# Patient Record
Sex: Male | Born: 1980 | Race: White | Hispanic: No | Marital: Single | Smoking: Current every day smoker
Health system: Southern US, Community
[De-identification: ages and names within clinical notes are randomized; demographics above are authoritative.]

## PROBLEM LIST (undated history)

## (undated) DIAGNOSIS — M25569 Pain in unspecified knee: Secondary | ICD-10-CM

## (undated) DIAGNOSIS — M549 Dorsalgia, unspecified: Secondary | ICD-10-CM

## (undated) HISTORY — PX: KNEE SURGERY: SHX244

---
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
## 2003-04-20 ENCOUNTER — Emergency Department (HOSPITAL_COMMUNITY): Admission: EM | Admit: 2003-04-20 | Discharge: 2003-04-20 | Payer: Self-pay | Admitting: Emergency Medicine

## 2003-07-30 ENCOUNTER — Emergency Department (HOSPITAL_COMMUNITY): Admission: EM | Admit: 2003-07-30 | Discharge: 2003-07-30 | Payer: Self-pay | Admitting: Emergency Medicine

## 2005-01-29 ENCOUNTER — Emergency Department (HOSPITAL_COMMUNITY): Admission: EM | Admit: 2005-01-29 | Discharge: 2005-01-29 | Payer: Self-pay | Admitting: Emergency Medicine

## 2007-01-07 ENCOUNTER — Emergency Department (HOSPITAL_COMMUNITY): Admission: EM | Admit: 2007-01-07 | Discharge: 2007-01-08 | Payer: Self-pay | Admitting: Emergency Medicine

## 2007-05-15 ENCOUNTER — Emergency Department (HOSPITAL_COMMUNITY): Admission: EM | Admit: 2007-05-15 | Discharge: 2007-05-15 | Payer: Self-pay | Admitting: Emergency Medicine

## 2007-05-30 ENCOUNTER — Emergency Department (HOSPITAL_COMMUNITY): Admission: EM | Admit: 2007-05-30 | Discharge: 2007-05-30 | Payer: Self-pay | Admitting: Emergency Medicine

## 2008-02-20 ENCOUNTER — Emergency Department (HOSPITAL_COMMUNITY): Admission: EM | Admit: 2008-02-20 | Discharge: 2008-02-20 | Payer: Self-pay | Admitting: Emergency Medicine

## 2008-11-19 ENCOUNTER — Emergency Department (HOSPITAL_COMMUNITY): Admission: EM | Admit: 2008-11-19 | Discharge: 2008-11-19 | Payer: Self-pay | Admitting: Emergency Medicine

## 2009-12-22 ENCOUNTER — Emergency Department (HOSPITAL_COMMUNITY)
Admission: EM | Admit: 2009-12-22 | Discharge: 2009-12-22 | Payer: Self-pay | Source: Home / Self Care | Admitting: Emergency Medicine

## 2010-02-18 ENCOUNTER — Emergency Department (HOSPITAL_COMMUNITY)
Admission: EM | Admit: 2010-02-18 | Discharge: 2010-02-19 | Payer: Self-pay | Source: Home / Self Care | Admitting: Emergency Medicine

## 2010-04-03 LAB — URINE MICROSCOPIC-ADD ON

## 2010-04-03 LAB — URINALYSIS, ROUTINE W REFLEX MICROSCOPIC
Glucose, UA: NEGATIVE mg/dL
Nitrite: NEGATIVE
Specific Gravity, Urine: 1.007 (ref 1.005–1.030)

## 2010-04-03 LAB — GC/CHLAMYDIA PROBE AMP, GENITAL: GC Probe Amp, Genital: POSITIVE — AB

## 2010-04-16 ENCOUNTER — Emergency Department (HOSPITAL_COMMUNITY)
Admission: EM | Admit: 2010-04-16 | Discharge: 2010-04-17 | Disposition: A | Discharge status: Home / Self Care | Payer: Self-pay | Attending: Emergency Medicine | Admitting: Emergency Medicine

## 2010-04-16 ENCOUNTER — Emergency Department (HOSPITAL_COMMUNITY): Payer: Self-pay

## 2010-04-16 DIAGNOSIS — M25569 Pain in unspecified knee: Secondary | ICD-10-CM | POA: Insufficient documentation

## 2010-05-07 LAB — RAPID URINE DRUG SCREEN, HOSP PERFORMED
Amphetamines: NOT DETECTED
Cocaine: NOT DETECTED
Opiates: POSITIVE — AB

## 2012-06-15 ENCOUNTER — Emergency Department (HOSPITAL_COMMUNITY): Payer: Self-pay

## 2012-06-15 ENCOUNTER — Encounter (HOSPITAL_COMMUNITY): Payer: Self-pay | Admitting: Adult Health

## 2012-06-15 ENCOUNTER — Emergency Department (HOSPITAL_COMMUNITY)
Admission: EM | Admit: 2012-06-15 | Discharge: 2012-06-16 | Disposition: A | Discharge status: Home / Self Care | Payer: Self-pay | Attending: Emergency Medicine | Admitting: Emergency Medicine

## 2012-06-15 DIAGNOSIS — S83104A Unspecified dislocation of right knee, initial encounter: Secondary | ICD-10-CM

## 2012-06-15 DIAGNOSIS — Y9389 Activity, other specified: Secondary | ICD-10-CM | POA: Insufficient documentation

## 2012-06-15 DIAGNOSIS — S83106A Unspecified dislocation of unspecified knee, initial encounter: Secondary | ICD-10-CM | POA: Insufficient documentation

## 2012-06-15 DIAGNOSIS — F17200 Nicotine dependence, unspecified, uncomplicated: Secondary | ICD-10-CM | POA: Insufficient documentation

## 2012-06-15 DIAGNOSIS — Y929 Unspecified place or not applicable: Secondary | ICD-10-CM | POA: Insufficient documentation

## 2012-06-15 DIAGNOSIS — X500XXA Overexertion from strenuous movement or load, initial encounter: Secondary | ICD-10-CM | POA: Insufficient documentation

## 2012-06-15 NOTE — ED Notes (Signed)
Resents with knee pain that began 2 hours ago, he states he fell on his right knee and it began to swell states he heard a pop. Knee is red, no swelling noted.

## 2012-06-15 NOTE — ED Notes (Signed)
The pt is c/o pain in his rt knee he is crying.  He lifted his son earlier tonight one hour ago and since then he has had rt knee pain.  Red but no swelling

## 2012-06-16 MED ORDER — OXYCODONE-ACETAMINOPHEN 5-325 MG PO TABS: 1.0000 | ORAL_TABLET | ORAL | Status: DC | PRN

## 2012-06-16 MED ORDER — OXYCODONE-ACETAMINOPHEN 5-325 MG PO TABS
2.0000 | ORAL_TABLET | Freq: Once | ORAL | Status: AC
  Administered 2012-06-16: 2 via ORAL
  Filled 2012-06-16: qty 2

## 2012-06-16 NOTE — ED Provider Notes (Signed)
History     CSN: [id number]  Arrival date & time 06/15/12  2251   First MD Initiated Contact with Patient 06/15/12 2331      Chief Complaint  Patient presents with  . Knee Pain    (Consider location/radiation/quality/duration/timing/severity/associated sxs/prior treatment) HPI Comments: Patient states he planted his R foot than twisted felt/heard a POP in R knee now painful with movement ambulation.  NO Meds PTA   Patient is a 32 y.o. male presenting with knee pain. The history is provided by the patient.  Knee Pain Location:  Knee Time since incident:  3 hours Injury: no   Pain details:    Quality:  Throbbing   Radiates to:  Does not radiate   Severity:  Severe   Duration:  3 hours   Timing:  Constant Chronicity:  New Dislocation: no   Prior injury to area:  No Worsened by:  Extension and flexion Ineffective treatments:  None tried Associated symptoms: no fever     History reviewed. No pertinent past medical history.  History reviewed. No pertinent past surgical history.  History reviewed. No pertinent family history.  History  Substance Use Topics  . Smoking status: Current Every Day Smoker    Types: Cigarettes  . Smokeless tobacco: Not on file  . Alcohol Use: No      Review of Systems  Constitutional: Negative for fever and chills.  Musculoskeletal: Positive for arthralgias. Negative for joint swelling.  Skin: Negative for wound.  All other systems reviewed and are negative.    Allergies  Review of patient's allergies indicates no known allergies.  Home Medications   Current Outpatient Rx  Name  Route  Sig  Dispense  Refill  . oxyCODONE-acetaminophen (PERCOCET/ROXICET) 5-325 MG per tablet   Oral   Take 1 tablet by mouth every 4 (four) hours as needed for pain.   20 tablet   0     BP 116/91  Pulse 75  Temp(Src) 98.4 F (36.9 C) (Oral)  Resp 18  SpO2 98%  Physical Exam  Nursing note and vitals reviewed. Constitutional: He is  oriented to person, place, and time. He appears well-developed and well-nourished.  HENT:  Head: Normocephalic.  Eyes: Pupils are equal, round, and reactive to light.  Neck: Normal range of motion.  Cardiovascular: Normal rate.   Musculoskeletal: He exhibits tenderness. He exhibits no edema.       Right knee: He exhibits decreased range of motion. He exhibits no swelling, no effusion, no ecchymosis, no deformity, no laceration, no erythema, normal alignment, no LCL laxity and no MCL laxity. Tenderness found. Lateral joint line and LCL tenderness noted. No medial joint line tenderness noted.  Neurological: He is alert and oriented to person, place, and time.    ED Course  Procedures (including critical care time)  Labs Reviewed - No data to display Dg Knee Complete 4 Views Right  06/16/2012   *RADIOLOGY REPORT*  Clinical Data: Lateral and posterior right knee pain; heard pop at the right knee.  RIGHT KNEE - COMPLETE 4+ VIEW  Comparison: None.  Findings: There is no evidence of fracture or dislocation.  The joint spaces are preserved.  No significant degenerative change is seen; the patellofemoral joint is grossly unremarkable in appearance.  No significant joint effusion is seen.  The visualized soft tissues are normal in appearance.  IMPRESSION: No evidence of fracture or dislocation.   Original Report Authenticated By: Tonia Ghent, M.D.     1. Knee dislocation, right, initial  encounter       MDM   Xray reviewed no fx, dislocation        Arman Filter, NP 06/16/12 0045

## 2012-06-16 NOTE — ED Notes (Signed)
Pain med given ortho tech called for knee immobilizer and crutches

## 2012-06-16 NOTE — ED Notes (Signed)
The pt has stopped crying now.  Sitting in a chair.  Waiting for orders

## 2012-06-16 NOTE — ED Provider Notes (Signed)
Medical screening examination/treatment/procedure(s) were performed by non-physician practitioner and as supervising physician I was immediately available for consultation/collaboration. Devoria Albe, MD, FACEP    Ward Givens, MD 06/16/12 [phone]

## 2013-07-25 ENCOUNTER — Emergency Department (HOSPITAL_COMMUNITY): Payer: Self-pay

## 2013-07-25 ENCOUNTER — Encounter (HOSPITAL_COMMUNITY): Payer: Self-pay | Admitting: Emergency Medicine

## 2013-07-25 ENCOUNTER — Emergency Department (HOSPITAL_COMMUNITY)
Admission: EM | Admit: 2013-07-25 | Discharge: 2013-07-25 | Disposition: A | Discharge status: Home / Self Care | Payer: Self-pay | Attending: Emergency Medicine | Admitting: Emergency Medicine

## 2013-07-25 DIAGNOSIS — F1910 Other psychoactive substance abuse, uncomplicated: Secondary | ICD-10-CM

## 2013-07-25 DIAGNOSIS — G929 Unspecified toxic encephalopathy: Secondary | ICD-10-CM | POA: Insufficient documentation

## 2013-07-25 DIAGNOSIS — R4182 Altered mental status, unspecified: Secondary | ICD-10-CM | POA: Insufficient documentation

## 2013-07-25 DIAGNOSIS — Y92410 Unspecified street and highway as the place of occurrence of the external cause: Secondary | ICD-10-CM | POA: Insufficient documentation

## 2013-07-25 DIAGNOSIS — S20219A Contusion of unspecified front wall of thorax, initial encounter: Secondary | ICD-10-CM | POA: Insufficient documentation

## 2013-07-25 DIAGNOSIS — F17200 Nicotine dependence, unspecified, uncomplicated: Secondary | ICD-10-CM | POA: Insufficient documentation

## 2013-07-25 DIAGNOSIS — Y9389 Activity, other specified: Secondary | ICD-10-CM | POA: Insufficient documentation

## 2013-07-25 DIAGNOSIS — G92 Toxic encephalopathy: Secondary | ICD-10-CM

## 2013-07-25 DIAGNOSIS — T148XXA Other injury of unspecified body region, initial encounter: Secondary | ICD-10-CM

## 2013-07-25 DIAGNOSIS — F1110 Opioid abuse, uncomplicated: Secondary | ICD-10-CM | POA: Insufficient documentation

## 2013-07-25 LAB — CBC WITH DIFFERENTIAL/PLATELET
BASOS PCT: 0 % (ref 0–1)
Basophils Absolute: 0 10*3/uL (ref 0.0–0.1)
EOS ABS: 0.1 10*3/uL (ref 0.0–0.7)
EOS PCT: 2 % (ref 0–5)
HEMATOCRIT: 39.8 % (ref 39.0–52.0)
HEMOGLOBIN: 13 g/dL (ref 13.0–17.0)
LYMPHS ABS: 1.3 10*3/uL (ref 0.7–4.0)
Lymphocytes Relative: 18 % (ref 12–46)
MCH: 29.7 pg (ref 26.0–34.0)
MCHC: 32.7 g/dL (ref 30.0–36.0)
MCV: 91.1 fL (ref 78.0–100.0)
MONO ABS: 0.7 10*3/uL (ref 0.1–1.0)
MONOS PCT: 9 % (ref 3–12)
Neutro Abs: 5.2 10*3/uL (ref 1.7–7.7)
Neutrophils Relative %: 71 % (ref 43–77)
Platelets: 153 10*3/uL (ref 150–400)
RBC: 4.37 MIL/uL (ref 4.22–5.81)
RDW: 14.4 % (ref 11.5–15.5)
WBC: 7.3 10*3/uL (ref 4.0–10.5)

## 2013-07-25 LAB — URINALYSIS, ROUTINE W REFLEX MICROSCOPIC
BILIRUBIN URINE: NEGATIVE
GLUCOSE, UA: NEGATIVE mg/dL
Hgb urine dipstick: NEGATIVE
Ketones, ur: NEGATIVE mg/dL
Leukocytes, UA: NEGATIVE
NITRITE: NEGATIVE
Protein, ur: NEGATIVE mg/dL
SPECIFIC GRAVITY, URINE: 1.037 — AB (ref 1.005–1.030)
Urobilinogen, UA: 1 mg/dL (ref 0.0–1.0)
pH: 6 (ref 5.0–8.0)

## 2013-07-25 LAB — BASIC METABOLIC PANEL
Anion gap: 13 (ref 5–15)
BUN: 11 mg/dL (ref 6–23)
CO2: 26 mEq/L (ref 19–32)
CREATININE: 0.91 mg/dL (ref 0.50–1.35)
Calcium: 8.9 mg/dL (ref 8.4–10.5)
Chloride: 100 mEq/L (ref 96–112)
GLUCOSE: 102 mg/dL — AB (ref 70–99)
Potassium: 3.5 mEq/L — ABNORMAL LOW (ref 3.7–5.3)
Sodium: 139 mEq/L (ref 137–147)

## 2013-07-25 LAB — CBG MONITORING, ED: Glucose-Capillary: 73 mg/dL (ref 70–99)

## 2013-07-25 LAB — ETHANOL: Alcohol, Ethyl (B): <11 mg/dL (ref 0–11)

## 2013-07-25 LAB — ACETAMINOPHEN LEVEL: Acetaminophen (Tylenol), Serum: <15 ug/mL (ref 10–30)

## 2013-07-25 MED ORDER — SODIUM CHLORIDE 0.9 % IV BOLUS (SEPSIS)
1000.0000 mL | Freq: Once | INTRAVENOUS | Status: AC
  Administered 2013-07-25: 1000 mL via INTRAVENOUS

## 2013-07-25 MED ORDER — IOHEXOL 300 MG/ML  SOLN
80.0000 mL | Freq: Once | INTRAMUSCULAR | Status: AC | PRN
  Administered 2013-07-25: 80 mL via INTRAVENOUS
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
## 2013-07-25 NOTE — ED Notes (Signed)
Pt discharged to home with family. NAD.  
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
## 2013-07-25 NOTE — Discharge Instructions (Signed)
If you were given medicines take as directed.  If you are on coumadin or contraceptives realize their levels and effectiveness is altered by many different medicines.  If you have any reaction (rash, tongues swelling, other) to the medicines stop taking and see a physician.   Please follow up as directed and return to the ER or see a physician for new or worsening symptoms.  Thank you. Filed Vitals:   07/25/13 0955 07/25/13 1015 07/25/13 1030 07/25/13 1045  BP: 104/61 108/62 115/70 108/66  Pulse:  57 63 56  Temp: 97.6 F (36.4 C)     TempSrc: Oral     Resp: 16 14 13 16   Height:      Weight:      SpO2: 97% 97% 95% 98%

## 2013-07-26 ENCOUNTER — Encounter (HOSPITAL_COMMUNITY): Payer: Self-pay | Admitting: Adult Health

## 2013-09-16 ENCOUNTER — Encounter (HOSPITAL_COMMUNITY): Payer: Self-pay | Admitting: Emergency Medicine

## 2013-09-16 ENCOUNTER — Emergency Department (HOSPITAL_COMMUNITY)
Admission: EM | Admit: 2013-09-16 | Discharge: 2013-09-16 | Disposition: A | Discharge status: Home / Self Care | Payer: Self-pay | Attending: Emergency Medicine | Admitting: Emergency Medicine

## 2013-09-16 DIAGNOSIS — S61409A Unspecified open wound of unspecified hand, initial encounter: Secondary | ICD-10-CM | POA: Insufficient documentation

## 2013-09-16 DIAGNOSIS — W268XXA Contact with other sharp object(s), not elsewhere classified, initial encounter: Secondary | ICD-10-CM | POA: Insufficient documentation

## 2013-09-16 DIAGNOSIS — F17200 Nicotine dependence, unspecified, uncomplicated: Secondary | ICD-10-CM | POA: Insufficient documentation

## 2013-09-16 DIAGNOSIS — Y929 Unspecified place or not applicable: Secondary | ICD-10-CM | POA: Insufficient documentation

## 2013-09-16 DIAGNOSIS — IMO0002 Reserved for concepts with insufficient information to code with codable children: Secondary | ICD-10-CM

## 2013-09-16 DIAGNOSIS — Y9389 Activity, other specified: Secondary | ICD-10-CM | POA: Insufficient documentation

## 2013-09-16 MED ORDER — CEPHALEXIN 500 MG PO CAPS: 500.0000 mg | ORAL_CAPSULE | Freq: Three times a day (TID) | ORAL | Status: DC

## 2013-09-16 MED ORDER — HYDROCODONE-ACETAMINOPHEN 5-325 MG PO TABS: ORAL_TABLET | ORAL | Status: DC

## 2013-09-16 NOTE — Discharge Instructions (Signed)

## 2013-09-16 NOTE — ED Notes (Signed)
Pt arrives for eval of laceration to right hand from a piece of glass yesterday. Bleeding controlled at this time, pt states he thought it would heal on its on but started bleeding again today. nad noted. Pulses present and strong to right hand. Axox4

## 2013-09-16 NOTE — ED Provider Notes (Signed)
  Chief Complaint   Chief Complaint  Patient presents with  . Extremity Laceration    History of Present Illness   Billy Miles is a 33 year old male who cut his right hand on glass yesterday around 2 PM. He has a laceration on the thenar eminence. He's had trouble controlling the bleeding. He has a full range of motion of all digits. He denies any numbness or tingling. He is up-to-date on tetanus immunization.  Review of Systems   Other than as noted above, the patient denies any of the following symptoms: Musculoskeletal:  No joint pain or decreased range of motion. Neuro:  No numbness, tingling, or weakness.  PMFSH   Past medical history, family history, social history, meds, and allergies were reviewed.   Physical Examination     Vital signs:  BP 122/91  Pulse 84  Temp(Src) 98.1 F (36.7 C) (Oral)  Resp 16  Ht  (1.626 m)  Wt 170 lb (77.111 kg)  BMI 29.17 kg/m2  SpO2 97% Ext:  There is a 1.5 cm laceration on the thenar eminence. There is no foreign body or contamination. He has a full range of motion of all digits, full function of flexor tendons, and normal sensation.  All other joints had a full ROM without pain.  Pulses were full.  Good capillary refill in all digits.  No edema. Neurological:  Alert and oriented.  No muscle weakness.  Sensation was intact to light touch.   Procedure Note:  Verbal informed consent was obtained.  The patient was informed of the risks and benefits of the procedure and understands and accepts.  A time out was called and the identity of the patient and correct procedure were confirmed.   The laceration area described above was prepped with Betadine and saline  and anesthetized with 5 mL of 2% Xylocaine without epinephrine.  The wound was then closed as follows:  Wound edges were approximated with 4 5-0 nylon sutures.  There were no immediate complications, and the patient tolerated the procedure well. The laceration was then cleansed,  Bacitracin ointment was applied and a clean, dry pressure dressing was put on.    Assessment   The encounter diagnosis was Laceration.  Plan   1.  Meds:  The following meds were prescribed:   Discharge Medication List as of 09/16/2013  4:37 PM    START taking these medications   Details  cephALEXin (KEFLEX) 500 MG capsule Take 1 capsule (500 mg total) by mouth 3 (three) times daily., Starting 09/16/2013, Until Discontinued, Print    HYDROcodone-acetaminophen (NORCO/VICODIN) 5-325 MG per tablet 1 to 2 tabs every 4 to 6 hours as needed for pain., Print        2.  Patient Education/Counseling:  The patient was given appropriate handouts, self care instructions, and instructed in symptomatic relief. Instructions were given for wound care.    3.  Follow up:  The patient was told to follow up immediately if there is any sign of infection.The patient will return in 14 days for suture removal.      Reuben Likes, MD 09/16/13 2024

## 2014-01-21 ENCOUNTER — Encounter (HOSPITAL_COMMUNITY): Payer: Self-pay | Admitting: Emergency Medicine

## 2014-01-21 ENCOUNTER — Emergency Department (HOSPITAL_COMMUNITY)
Admission: EM | Admit: 2014-01-21 | Discharge: 2014-01-21 | Disposition: A | Discharge status: Home / Self Care | Payer: Self-pay | Attending: Emergency Medicine | Admitting: Emergency Medicine

## 2014-01-21 DIAGNOSIS — Z79899 Other long term (current) drug therapy: Secondary | ICD-10-CM | POA: Insufficient documentation

## 2014-01-21 DIAGNOSIS — M5442 Lumbago with sciatica, left side: Secondary | ICD-10-CM | POA: Insufficient documentation

## 2014-01-21 DIAGNOSIS — Z792 Long term (current) use of antibiotics: Secondary | ICD-10-CM | POA: Insufficient documentation

## 2014-01-21 DIAGNOSIS — M5432 Sciatica, left side: Secondary | ICD-10-CM

## 2014-01-21 DIAGNOSIS — Z720 Tobacco use: Secondary | ICD-10-CM | POA: Insufficient documentation

## 2014-01-21 MED ORDER — TRAMADOL HCL 50 MG PO TABS
50.0000 mg | ORAL_TABLET | Freq: Once | ORAL | Status: AC
  Administered 2014-01-21: 50 mg via ORAL
  Filled 2014-01-21: qty 1

## 2014-01-21 MED ORDER — CYCLOBENZAPRINE HCL 10 MG PO TABS: 10.0000 mg | ORAL_TABLET | Freq: Two times a day (BID) | ORAL | Status: DC | PRN

## 2014-01-21 MED ORDER — TRAMADOL HCL 50 MG PO TABS: 50.0000 mg | ORAL_TABLET | Freq: Four times a day (QID) | ORAL | Status: DC | PRN

## 2014-01-21 MED ORDER — PREDNISONE 20 MG PO TABS: 40.0000 mg | ORAL_TABLET | Freq: Every day | ORAL | Status: DC

## 2014-01-21 NOTE — ED Provider Notes (Signed)
CSN: [id number]     Arrival date & time 01/21/14  1319 History  This chart was scribed for non-physician practitioner, Emilia Beck, PA-C, working with Rolland Porter, MD by Charline Bills, ED Scribe. This patient was seen in room TR09C/TR09C and the patient's care was started at 2:55 PM.   Chief Complaint  Patient presents with  . Back Pain   The history is provided by the patient. No language interpreter was used.   HPI Comments: Billy Miles is a 34 y.o. male, with no pertinent medical history, who presents to the Emergency Department complaining of gradually worsening, constant lower back pain onset 2 days ago. Pt describes pain as a sharp, shooting sensation. Pain is exacerbated with leaning to the L and bending. No inury. He reports intermittent L leg numbness that started prior to back pain. Pt has been treating with 800 mg ibuprofen and a rice pack.  History reviewed. No pertinent past medical history. Past Surgical History  Procedure Laterality Date  . Knee surgery     History reviewed. No pertinent family history. History  Substance Use Topics  . Smoking status: Current Every Day Smoker -- 1.00 packs/day    Types: Cigarettes  . Smokeless tobacco: Not on file  . Alcohol Use: Yes    Review of Systems  Musculoskeletal: Positive for back pain.  All other systems reviewed and are negative.  Allergies  Review of patient's allergies indicates no known allergies.  Home Medications   Prior to Admission medications   Medication Sig Start Date End Date Taking? Authorizing Provider  cephALEXin (KEFLEX) 500 MG capsule Take 1 capsule (500 mg total) by mouth 3 (three) times daily. 09/16/13   Reuben Likes, MD  HYDROcodone-acetaminophen (NORCO/VICODIN) 5-325 MG per tablet 1 to 2 tabs every 4 to 6 hours as needed for pain. 09/16/13   Reuben Likes, MD   Triage Vitals: BP 127/67 mmHg  Pulse 98  Temp(Src) 97.7 F (36.5 C) (Oral)  Resp 24  SpO2 97% Physical Exam   Constitutional: He is oriented to person, place, and time. He appears well-developed and well-nourished. No distress.  HENT:  Head: Normocephalic and atraumatic.  Eyes: Conjunctivae and EOM are normal.  Neck: Neck supple.  Cardiovascular: Normal rate.   Pulmonary/Chest: Effort normal.  Musculoskeletal: Normal range of motion.  No midline spine tenderness to palpation. L lower paraspinal tenderness to palpation.   Neurological: He is alert and oriented to person, place, and time. He has normal strength.  Extremities strength and sensation intact bilaterally.   Skin: Skin is warm and dry.  Psychiatric: He has a normal mood and affect. His behavior is normal.  Nursing note and vitals reviewed.  ED Course  Procedures (including critical care time) DIAGNOSTIC STUDIES: Oxygen Saturation is 97% on RA, normal by my interpretation.    COORDINATION OF CARE: 2:59 PM-Discussed treatment plan which includes Ultram, Prednisone and Flexeril with pt at bedside and pt agreed to plan.   Labs Review Labs Reviewed - No data to display  Imaging Review No results found.   EKG Interpretation None      MDM   Final diagnoses:  Sciatica, left    Patient likely has sciatica on the left. No injury. No bladder/bowel incontinence or saddle paresthesias. Vitals stable and patient afebrile. Patient will have Tramadol, Flexeril, and prednisone.   I personally performed the services described in this documentation, which was scribed in my presence. The recorded information has been reviewed and is accurate.  Emilia Beck, PA-C 01/21/14 1634  Rolland Porter, MD 01/28/14 2351

## 2014-01-21 NOTE — ED Notes (Signed)
Pt c/o lower back pain x 2 days worse on left side and worse with movement; pt denies obvious injury

## 2014-01-21 NOTE — Discharge Instructions (Signed)
Take tramadol for pain as needed. Take prednisone as directed until gone. Take Flexeril as needed for muscle spasm. You may take these medications together. Refer to attached documents for more information.

## 2014-11-07 ENCOUNTER — Emergency Department (HOSPITAL_COMMUNITY): Payer: Self-pay

## 2014-11-07 ENCOUNTER — Emergency Department (HOSPITAL_COMMUNITY)
Admission: EM | Admit: 2014-11-07 | Discharge: 2014-11-07 | Disposition: A | Discharge status: Home / Self Care | Payer: Self-pay | Attending: Emergency Medicine | Admitting: Emergency Medicine

## 2014-11-07 ENCOUNTER — Encounter (HOSPITAL_COMMUNITY): Payer: Self-pay | Admitting: *Deleted

## 2014-11-07 DIAGNOSIS — Z792 Long term (current) use of antibiotics: Secondary | ICD-10-CM | POA: Insufficient documentation

## 2014-11-07 DIAGNOSIS — J209 Acute bronchitis, unspecified: Secondary | ICD-10-CM | POA: Insufficient documentation

## 2014-11-07 DIAGNOSIS — Z720 Tobacco use: Secondary | ICD-10-CM | POA: Insufficient documentation

## 2014-11-07 MED ORDER — ALBUTEROL SULFATE HFA 108 (90 BASE) MCG/ACT IN AERS
2.0000 | INHALATION_SPRAY | Freq: Once | RESPIRATORY_TRACT | Status: AC
  Administered 2014-11-07: 2 via RESPIRATORY_TRACT
  Filled 2014-11-07: qty 6.7

## 2014-11-07 MED ORDER — BENZONATATE 100 MG PO CAPS: 100.0000 mg | ORAL_CAPSULE | Freq: Three times a day (TID) | ORAL | Status: DC

## 2014-11-07 NOTE — ED Notes (Signed)
Patient transported to X-ray 

## 2014-11-07 NOTE — ED Notes (Signed)
Pt reports chest congestion, productive cough and headache. Unable to sleep due to cough.

## 2014-11-07 NOTE — ED Provider Notes (Signed)
CSN: [id number]     Arrival date & time 11/07/14  1746 History  By signing my name below, I, Jarvis Morganaylor Ferguson, attest that this documentation has been prepared under the direction and in the presence of Joycie PeekBenjamin Robbye Dede, PA-C Electronically Signed: Jarvis Morganaylor Ferguson, ED Scribe. 11/07/2014. 8:16 PM.   Chief Complaint  Patient presents with  . Cough    The history is provided by the patient. No language interpreter was used.    HPI Comments: Billy Miles is a 34 y.o. male who presents to the Emergency Department complaining of intermittent, progressively worsening, moderate, cough productive of green sputum onset 2 days. He reports associated congestion, rhinorrhea, mild wheezing, HA and mild sore throat. He states the cough is keeping him up at night. Pt endorses he has been taking Nyquil, Dayquil and Mucinex with only mild relief. He reports sick contacts through family members. Pt is a current everyday 0.5 ppd smoker. He denies any fever.   History reviewed. No pertinent past medical history. Past Surgical History  Procedure Laterality Date  . Knee surgery     History reviewed. No pertinent family history. Social History  Substance Use Topics  . Smoking status: Current Every Day Smoker -- 1.00 packs/day    Types: Cigarettes  . Smokeless tobacco: None  . Alcohol Use: Yes    Review of Systems  Constitutional: Negative for fever and chills.  HENT: Positive for congestion and rhinorrhea.   Respiratory: Positive for cough and wheezing.   Neurological: Positive for headaches.  All other systems reviewed and are negative.     Allergies  Review of patient's allergies indicates no known allergies.  Home Medications   Prior to Admission medications   Medication Sig Start Date End Date Taking? Authorizing Provider  benzonatate (TESSALON) 100 MG capsule Take 1 capsule (100 mg total) by mouth every 8 (eight) hours. 11/07/14   Joycie PeekBenjamin Teagyn Fishel, PA-C  cephALEXin (KEFLEX) 500 MG  capsule Take 1 capsule (500 mg total) by mouth 3 (three) times daily. 09/16/13   Reuben Likesavid C Keller, MD  cyclobenzaprine (FLEXERIL) 10 MG tablet Take 1 tablet (10 mg total) by mouth 2 (two) times daily as needed for muscle spasms. 01/21/14   Emilia BeckKaitlyn Szekalski, PA-C  HYDROcodone-acetaminophen (NORCO/VICODIN) 5-325 MG per tablet 1 to 2 tabs every 4 to 6 hours as needed for pain. 09/16/13   Reuben Likesavid C Keller, MD  predniSONE (DELTASONE) 20 MG tablet Take 2 tablets (40 mg total) by mouth daily. Take 40 mg by mouth daily for 3 days, then 20mg  by mouth daily for 3 days, then 10mg  daily for 3 days 01/21/14   Emilia BeckKaitlyn Szekalski, PA-C  traMADol (ULTRAM) 50 MG tablet Take 1 tablet (50 mg total) by mouth every 6 (six) hours as needed. 01/21/14   Emilia BeckKaitlyn Szekalski, PA-C   Triage Vitals: BP 137/82 mmHg  Pulse 68  Temp(Src) 98.5 F (36.9 C) (Oral)  Resp 18  SpO2 97%  Physical Exam  Constitutional: He is oriented to person, place, and time. He appears well-developed and well-nourished. No distress.  HENT:  Head: Normocephalic and atraumatic.  Nasal congestion  Eyes: Conjunctivae and EOM are normal.  Neck: Neck supple. No tracheal deviation present.  Cardiovascular: Normal rate, regular rhythm and normal heart sounds.   Pulmonary/Chest: Effort normal. No respiratory distress. He has wheezes (mild, left upper lobe).  Musculoskeletal: Normal range of motion.  Neurological: He is alert and oriented to person, place, and time.  Skin: Skin is warm and dry.  Psychiatric: He has  a normal mood and affect. His behavior is normal.  Nursing note and vitals reviewed.   ED Course  Procedures (including critical care time)  DIAGNOSTIC STUDIES: Oxygen Saturation is 97% on RA, normal by my interpretation.    COORDINATION OF CARE:  7:50 PM- Will order CXR. Pt advised of plan for treatment and pt agrees.  Labs Review Labs Reviewed - No data to display  Imaging Review Dg Chest 2 View  11/07/2014  CLINICAL DATA:   Intermittent progressively worsening moderate cough productive of green sputum beginning 2 days ago, associated congestion, rhinorrhea, mild wheezing, headache and sore throat EXAM: CHEST  2 VIEW COMPARISON:  11/19/2008 FINDINGS: Normal heart size, mediastinal contours, and pulmonary vascularity. Mild central peribronchial thickening. Lungs clear. No pulmonary infiltrate, pleural effusion or pneumothorax. Bones unremarkable. IMPRESSION: Mild bronchitic changes. Electronically Signed   By: Ulyses Southward M.D.   On: 11/07/2014 20:12   I have personally reviewed and evaluated these images and lab results as part of my medical decision-making.   EKG Interpretation None     Meds given in ED:  Medications  albuterol (PROVENTIL HFA;VENTOLIN HFA) 108 (90 BASE) MCG/ACT inhaler 2 puff (2 puffs Inhalation Given 11/07/14 1959)    New Prescriptions   BENZONATATE (TESSALON) 100 MG CAPSULE    Take 1 capsule (100 mg total) by mouth every 8 (eight) hours.   Filed Vitals:   11/07/14 1815  BP: 137/82  Pulse: 68  Temp: 98.5 F (36.9 C)  TempSrc: Oral  Resp: 18  SpO2: 97%    MDM  Vitals stable - WNL -afebrile Pt resting comfortably in ED. PE--mild wheezing pulmonary exam. Physical exam otherwise unremarkable. Imaging--chest x-ray shows mild bronchitic changes.  DDX-patient with likely acute viral bronchitis. Will treat with albuterol inhaler and Tessalon for cough. Low suspicion for other acute cardiopulmonary pathology such as ACS, PE or pneumonia.  Follow up with PCP as needed. Overall, patient appears well, independently stable with normal vital signs and is appropriate for discharge. I discussed all relevant lab findings and imaging results with pt and they verbalized understanding. Discussed f/u with PCP within 48 hrs and return precautions, pt very amenable to plan.  Final diagnoses:  Acute bronchitis, unspecified organism    I personally performed the services described in this  documentation, which was scribed in my presence. The recorded information has been reviewed and is accurate.    Joycie Peek, PA-C 11/07/14 2020  Donnetta Hutching, MD 11/08/14 [phone]

## 2014-11-07 NOTE — ED Notes (Signed)
Patient is alert and orientedx4.  Patient was explained discharge instructions and they understood them with no questions.   

## 2014-11-07 NOTE — Discharge Instructions (Signed)
Your symptoms are likely due to a virus. This problem should resolve on its own with time. Please take your medications as prescribed. Follow-up with your doctor as needed for reevaluation. Return to ED for worsening symptoms.  Acute Bronchitis Bronchitis is inflammation of the airways that extend from the windpipe into the lungs (bronchi). The inflammation often causes mucus to develop. This leads to a cough, which is the most common symptom of bronchitis.  In acute bronchitis, the condition usually develops suddenly and goes away over time, usually in a couple weeks. Smoking, allergies, and asthma can make bronchitis worse. Repeated episodes of bronchitis may cause further lung problems.  CAUSES Acute bronchitis is most often caused by the same virus that causes a cold. The virus can spread from person to person (contagious) through coughing, sneezing, and touching contaminated objects. SIGNS AND SYMPTOMS   Cough.   Fever.   Coughing up mucus.   Body aches.   Chest congestion.   Chills.   Shortness of breath.   Sore throat.  DIAGNOSIS  Acute bronchitis is usually diagnosed through a physical exam. Your health care provider will also ask you questions about your medical history. Tests, such as chest X-rays, are sometimes done to rule out other conditions.  TREATMENT  Acute bronchitis usually goes away in a couple weeks. Oftentimes, no medical treatment is necessary. Medicines are sometimes given for relief of fever or cough. Antibiotic medicines are usually not needed but may be prescribed in certain situations. In some cases, an inhaler may be recommended to help reduce shortness of breath and control the cough. A cool mist vaporizer may also be used to help thin bronchial secretions and make it easier to clear the chest.  HOME CARE INSTRUCTIONS  Get plenty of rest.   Drink enough fluids to keep your urine clear or pale yellow (unless you have a medical condition that  requires fluid restriction). Increasing fluids may help thin your respiratory secretions (sputum) and reduce chest congestion, and it will prevent dehydration.   Take medicines only as directed by your health care provider.  If you were prescribed an antibiotic medicine, finish it all even if you start to feel better.  Avoid smoking and secondhand smoke. Exposure to cigarette smoke or irritating chemicals will make bronchitis worse. If you are a smoker, consider using nicotine gum or skin patches to help control withdrawal symptoms. Quitting smoking will help your lungs heal faster.   Reduce the chances of another bout of acute bronchitis by washing your hands frequently, avoiding people with cold symptoms, and trying not to touch your hands to your mouth, nose, or eyes.   Keep all follow-up visits as directed by your health care provider.  SEEK MEDICAL CARE IF: Your symptoms do not improve after 1 week of treatment.  SEEK IMMEDIATE MEDICAL CARE IF:  You develop an increased fever or chills.   You have chest pain.   You have severe shortness of breath.  You have bloody sputum.   You develop dehydration.  You faint or repeatedly feel like you are going to pass out.  You develop repeated vomiting.  You develop a severe headache. MAKE SURE YOU:   Understand these instructions.  Will watch your condition.  Will get help right away if you are not doing well or get worse.   This information is not intended to replace advice given to you by your health care provider. Make sure you discuss any questions you have with your health care  provider.   Document Released: 02/15/2004 Document Revised: 01/28/2014 Document Reviewed: 06/30/2012 Elsevier Interactive Patient Education [employer].

## 2015-02-18 ENCOUNTER — Encounter (HOSPITAL_COMMUNITY): Payer: Self-pay

## 2015-02-18 ENCOUNTER — Emergency Department (HOSPITAL_COMMUNITY)
Admission: EM | Admit: 2015-02-18 | Discharge: 2015-02-18 | Disposition: A | Discharge status: Home / Self Care | Payer: Self-pay | Attending: Emergency Medicine | Admitting: Emergency Medicine

## 2015-02-18 ENCOUNTER — Emergency Department (HOSPITAL_COMMUNITY): Payer: Self-pay

## 2015-02-18 DIAGNOSIS — Y998 Other external cause status: Secondary | ICD-10-CM | POA: Insufficient documentation

## 2015-02-18 DIAGNOSIS — Y9389 Activity, other specified: Secondary | ICD-10-CM | POA: Insufficient documentation

## 2015-02-18 DIAGNOSIS — S8392XA Sprain of unspecified site of left knee, initial encounter: Secondary | ICD-10-CM

## 2015-02-18 DIAGNOSIS — Y92410 Unspecified street and highway as the place of occurrence of the external cause: Secondary | ICD-10-CM | POA: Insufficient documentation

## 2015-02-18 DIAGNOSIS — F17210 Nicotine dependence, cigarettes, uncomplicated: Secondary | ICD-10-CM | POA: Insufficient documentation

## 2015-02-18 DIAGNOSIS — S39012A Strain of muscle, fascia and tendon of lower back, initial encounter: Secondary | ICD-10-CM | POA: Insufficient documentation

## 2015-02-18 DIAGNOSIS — S838X2A Sprain of other specified parts of left knee, initial encounter: Secondary | ICD-10-CM | POA: Insufficient documentation

## 2015-02-18 DIAGNOSIS — Z792 Long term (current) use of antibiotics: Secondary | ICD-10-CM | POA: Insufficient documentation

## 2015-02-18 MED ORDER — TRAMADOL HCL 50 MG PO TABS: 50.0000 mg | ORAL_TABLET | Freq: Four times a day (QID) | ORAL | Status: DC | PRN

## 2015-02-18 MED ORDER — HYDROMORPHONE HCL 1 MG/ML IJ SOLN
1.0000 mg | Freq: Once | INTRAMUSCULAR | Status: AC
  Administered 2015-02-18: 1 mg via INTRAMUSCULAR
  Filled 2015-02-18: qty 1

## 2015-02-18 MED ORDER — CYCLOBENZAPRINE HCL 10 MG PO TABS: 10.0000 mg | ORAL_TABLET | Freq: Two times a day (BID) | ORAL | Status: DC | PRN
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
## 2015-02-18 NOTE — Discharge Instructions (Signed)
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 them.  Try to keep your elbows locked down at your sides in order get the best strength from your shoulders when carrying an object.  Always ask for help when lifting heavy or awkward objects. INCORRECT LIFTING TECHNIQUES DO NOT:   Lock your knees when lifting, even if it is a small object.  Bend and twist. Pivot at your feet or  move your feet when needing to change directions.  Assume that you can safely pick up even a paper clip without proper posture.   This information is not intended to replace advice given to you by your health care provider. Make sure you discuss any questions you have with your health care provider.   Document Released: 01/07/2005 Document Revised: 01/28/2014 Document Reviewed: 04/21/2008 Elsevier Interactive Patient Education [employer]

## 2015-11-13 ENCOUNTER — Emergency Department (HOSPITAL_COMMUNITY)
Admission: EM | Admit: 2015-11-13 | Discharge: 2015-11-13 | Disposition: A | Discharge status: Home / Self Care | Payer: Self-pay | Attending: Emergency Medicine | Admitting: Emergency Medicine

## 2015-11-13 ENCOUNTER — Encounter (HOSPITAL_COMMUNITY): Payer: Self-pay | Admitting: Emergency Medicine

## 2015-11-13 ENCOUNTER — Emergency Department (HOSPITAL_COMMUNITY): Payer: Self-pay

## 2015-11-13 DIAGNOSIS — Y929 Unspecified place or not applicable: Secondary | ICD-10-CM | POA: Insufficient documentation

## 2015-11-13 DIAGNOSIS — M545 Low back pain: Secondary | ICD-10-CM | POA: Insufficient documentation

## 2015-11-13 DIAGNOSIS — Y939 Activity, unspecified: Secondary | ICD-10-CM | POA: Insufficient documentation

## 2015-11-13 DIAGNOSIS — F17210 Nicotine dependence, cigarettes, uncomplicated: Secondary | ICD-10-CM | POA: Insufficient documentation

## 2015-11-13 DIAGNOSIS — Y999 Unspecified external cause status: Secondary | ICD-10-CM | POA: Insufficient documentation

## 2015-11-13 MED ORDER — NAPROXEN 500 MG PO TABS: 500.0000 mg | ORAL_TABLET | Freq: Two times a day (BID) | ORAL | 0 refills | Status: DC

## 2015-11-13 MED ORDER — ACETAMINOPHEN 325 MG PO TABS
650.0000 mg | ORAL_TABLET | Freq: Once | ORAL | Status: AC
  Administered 2015-11-13: 650 mg via ORAL
  Filled 2015-11-13: qty 2

## 2015-11-13 MED ORDER — METHOCARBAMOL 500 MG PO TABS
1000.0000 mg | ORAL_TABLET | Freq: Once | ORAL | Status: AC
  Administered 2015-11-13: 1000 mg via ORAL
  Filled 2015-11-13: qty 2

## 2015-11-13 MED ORDER — METHOCARBAMOL 500 MG PO TABS: 500.0000 mg | ORAL_TABLET | Freq: Two times a day (BID) | ORAL | 0 refills | Status: DC | PRN
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
## 2016-06-30 ENCOUNTER — Encounter: Payer: Self-pay | Admitting: Emergency Medicine

## 2016-06-30 DIAGNOSIS — F1910 Other psychoactive substance abuse, uncomplicated: Secondary | ICD-10-CM | POA: Insufficient documentation

## 2016-06-30 DIAGNOSIS — M545 Low back pain: Secondary | ICD-10-CM | POA: Insufficient documentation

## 2016-06-30 DIAGNOSIS — R45851 Suicidal ideations: Secondary | ICD-10-CM | POA: Insufficient documentation

## 2016-06-30 DIAGNOSIS — F17210 Nicotine dependence, cigarettes, uncomplicated: Secondary | ICD-10-CM | POA: Insufficient documentation

## 2016-06-30 DIAGNOSIS — Z79899 Other long term (current) drug therapy: Secondary | ICD-10-CM | POA: Insufficient documentation

## 2016-06-30 LAB — COMPREHENSIVE METABOLIC PANEL
ALBUMIN: 4.2 g/dL (ref 3.5–5.0)
ALK PHOS: 85 U/L (ref 38–126)
ALT: 35 U/L (ref 17–63)
AST: 44 U/L — AB (ref 15–41)
Anion gap: 8 (ref 5–15)
BILIRUBIN TOTAL: 0.4 mg/dL (ref 0.3–1.2)
BUN: 14 mg/dL (ref 6–20)
CALCIUM: 9 mg/dL (ref 8.9–10.3)
CO2: 28 mmol/L (ref 22–32)
Chloride: 103 mmol/L (ref 101–111)
Creatinine, Ser: 1.13 mg/dL (ref 0.61–1.24)
GFR calc Af Amer: >60 mL/min (ref >60)
GFR calc non Af Amer: >60 mL/min (ref >60)
GLUCOSE: 76 mg/dL (ref 65–99)
Potassium: 3.2 mmol/L — ABNORMAL LOW (ref 3.5–5.1)
Sodium: 139 mmol/L (ref 135–145)
TOTAL PROTEIN: 7.6 g/dL (ref 6.5–8.1)

## 2016-06-30 LAB — ACETAMINOPHEN LEVEL: Acetaminophen (Tylenol), Serum: <10 ug/mL — ABNORMAL LOW (ref 10–30)

## 2016-06-30 LAB — ETHANOL: Alcohol, Ethyl (B): <5 mg/dL (ref <5)

## 2016-06-30 LAB — CBC
HEMATOCRIT: 39.3 % — AB (ref 40.0–52.0)
HEMOGLOBIN: 13.5 g/dL (ref 13.0–18.0)
MCH: 30.6 pg (ref 26.0–34.0)
MCHC: 34.2 g/dL (ref 32.0–36.0)
MCV: 89.5 fL (ref 80.0–100.0)
Platelets: 188 10*3/uL (ref 150–440)
RBC: 4.4 MIL/uL (ref 4.40–5.90)
RDW: 13.3 % (ref 11.5–14.5)
WBC: 10 10*3/uL (ref 3.8–10.6)

## 2016-06-30 LAB — SALICYLATE LEVEL: Salicylate Lvl: <7 mg/dL (ref 2.8–30.0)
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
## 2016-07-01 ENCOUNTER — Emergency Department: Payer: Self-pay

## 2016-07-01 ENCOUNTER — Emergency Department
Admission: EM | Admit: 2016-07-01 | Discharge: 2016-07-01 | Disposition: A | Discharge status: Home / Self Care | Payer: Self-pay | Attending: Emergency Medicine | Admitting: Emergency Medicine

## 2016-07-01 DIAGNOSIS — F1910 Other psychoactive substance abuse, uncomplicated: Secondary | ICD-10-CM

## 2016-07-01 DIAGNOSIS — R45851 Suicidal ideations: Secondary | ICD-10-CM

## 2016-07-01 DIAGNOSIS — F1110 Opioid abuse, uncomplicated: Secondary | ICD-10-CM

## 2016-07-01 DIAGNOSIS — F1410 Cocaine abuse, uncomplicated: Secondary | ICD-10-CM

## 2016-07-01 DIAGNOSIS — F1994 Other psychoactive substance use, unspecified with psychoactive substance-induced mood disorder: Secondary | ICD-10-CM

## 2016-07-01 LAB — URINE DRUG SCREEN, QUALITATIVE (ARMC ONLY)
Amphetamines, Ur Screen: NOT DETECTED
BARBITURATES, UR SCREEN: NOT DETECTED
Benzodiazepine, Ur Scrn: NOT DETECTED
COCAINE METABOLITE, UR ~~LOC~~: POSITIVE — AB
Cannabinoid 50 Ng, Ur ~~LOC~~: NOT DETECTED
MDMA (ECSTASY) UR SCREEN: NOT DETECTED
METHADONE SCREEN, URINE: NOT DETECTED
Opiate, Ur Screen: POSITIVE — AB
Phencyclidine (PCP) Ur S: NOT DETECTED
TRICYCLIC, UR SCREEN: NOT DETECTED

## 2016-07-01 MED ORDER — KETOROLAC TROMETHAMINE 60 MG/2ML IM SOLN
60.0000 mg | Freq: Once | INTRAMUSCULAR | Status: AC
  Administered 2016-07-01: 60 mg via INTRAMUSCULAR
  Filled 2016-07-01: qty 2

## 2016-07-01 MED ORDER — LIDOCAINE 5 % EX PTCH
1.0000 | MEDICATED_PATCH | CUTANEOUS | Status: DC
  Administered 2016-07-01: 1 via TRANSDERMAL
  Filled 2016-07-01: qty 1

## 2016-07-01 MED ORDER — DIAZEPAM 2 MG PO TABS
2.0000 mg | ORAL_TABLET | Freq: Once | ORAL | Status: AC
  Administered 2016-07-01: 2 mg via ORAL
  Filled 2016-07-01: qty 1
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
## 2016-07-01 NOTE — ED Notes (Signed)
Pt denies SI, HI, a/v hallucinations. Pt commits to safety for discharge. Follow up appointments,  treatment reviewed. Pt verbalized understanding of discharge instructions. All belongings returned to pt upon discharge. Pt picked up safely in ED lobby by girlfriend walked out by RN.Pt discharged in stable condition.

## 2016-07-01 NOTE — Consult Note (Signed)
[facility] Consult   Reason for Consult:  Consult for 36 year old man brought in over the weekend with commitment reporting suicidal statements Referring Physician:  Corky Downs Patient Identification: Billy Miles MRN:  [id number] Principal Diagnosis: Substance induced mood disorder ([place]) Diagnosis:   Patient Active Problem List   Diagnosis Date Noted  . Substance induced mood disorder ([place]) [F19.94] 07/01/2016  . Opiate abuse, episodic [F11.10] 07/01/2016  . Cocaine abuse [F14.10] 07/01/2016    Total Time spent with patient: 1 hour  Subjective:   Billy Miles is a 36 y.o. male patient admitted with "I don't know why, the police brought me here".  HPI:  Patient interviewed. Chart reviewed. 36 year old man brought to the emergency room by law enforcement with a commitment petition that states that he was laying on the railroad tracks and also that he asked the police to shoot him. Patient was described in a manner that suggests intoxication when he was brought in. On interview today the patient denies that he was lying on the railroad tracks. He says that he wishes resting in the grass near the railroad tracks. He admits that he made a comment to the police about shooting him but he says that it was a "joke". Patient says his mood has been fine. Denies any depression. Denies any suicidal thoughts at all. Denies psychosis. Denies any medical complaints. When directly confronted he admits to having used cocaine several days ago but says that he rarely if ever uses it otherwise. He is very evasive when discussing any narcotic use but admits that he did have some in him. Denies that he is drinking heavily. Not currently receiving any mental health treatment.  Social history: Lives with his girlfriend. 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  Medical history: Denies any significant medical problems no obvious acute injury  Substance abuse history: Very  evasive discussing this. No history of any substance abuse treatment. Does not appear to have any interest in any kind of substance abuse treatment.  Past Psychiatric History: Asked psychiatric history at all. No hospitalization no medication note encounter with therapy or psychiatrist. Denies ever trying to kill himself.  Risk to Self: Suicidal Ideation: No (Patient denies) Suicidal Intent: No (Patient denies) Is patient at risk for suicide?: No (Patient denies) Suicidal Plan?: No-Not Currently/Within Last 6 Months (Patient denies) Access to Means: No (Patient denies) What has been your use of drugs/alcohol within the last 12 months?: Alcohol & Cocaine How many times?: 0 Other Self Harm Risks: Active Addiction Triggers for Past Attempts: None known Intentional Self Injurious Behavior: None Risk to Others: Homicidal Ideation: No Thoughts of Harm to Others: No Current Homicidal Intent: No Current Homicidal Plan: No Access to Homicidal Means: No Identified Victim: Reports of none History of harm to others?: No Assessment of Violence: None Noted Violent Behavior Description: Reports of none Does patient have access to weapons?: No Criminal Charges Pending?: No Does patient have a court date: No Prior Inpatient Therapy: Prior Inpatient Therapy: No Prior Therapy Dates: Reports of none Prior Therapy Facilty/Provider(s): Reports of none Reason for Treatment: Reports of none Prior Outpatient Therapy: Prior Outpatient Therapy: No Prior Therapy Dates: Reports of none Prior Therapy Facilty/Provider(s): Reports of none Reason for Treatment: Reports of none Does patient have an ACCT team?: No Does patient have Intensive In-House Services?  : No Does patient have [facility] services? : No Does patient have P4CC services?: No  Past Medical History: History reviewed. No  pertinent past medical history.  Past Surgical History:  Procedure Laterality Date  . KNEE SURGERY     Family History:  History reviewed. No pertinent family history. Family Psychiatric  History: Denies being aware of any Social History:  History  Alcohol Use  . Yes     History  Drug Use  . Types: Cocaine, Marijuana    Social History   Social History  . Marital status: Single    Spouse name: N/A  . Number of children: N/A  . Years of education: N/A   Social History Main Topics  . Smoking status: Current Every Day Smoker    Packs/day: 1.00    Types: Cigarettes  . Smokeless tobacco: Never Used  . Alcohol use Yes  . Drug use: Yes    Types: Cocaine, Marijuana  . Sexual activity: Not Asked   Other Topics Concern  . None   Social History Narrative   ** Merged History Encounter **       Additional Social History:    Allergies:  No Known Allergies  Labs:  Results for orders placed or performed during the hospital encounter of 07/01/16 (from the past 48 hour(s))  Comprehensive metabolic panel     Status: Abnormal   Collection Time: 06/30/16  9:42 PM  Result Value Ref Range   Sodium 139 135 - 145 mmol/L   Potassium 3.2 (L) 3.5 - 5.1 mmol/L   Chloride 103 101 - 111 mmol/L   CO2 28 22 - 32 mmol/L   Glucose, Bld 76 65 - 99 mg/dL   BUN 14 6 - 20 mg/dL   Creatinine, Ser 1.13 0.61 - 1.24 mg/dL   Calcium 9.0 8.9 - 10.3 mg/dL   Total Protein 7.6 6.5 - 8.1 g/dL   Albumin 4.2 3.5 - 5.0 g/dL   AST 44 (H) 15 - 41 U/L   ALT 35 17 - 63 U/L   Alkaline Phosphatase 85 38 - 126 U/L   Total Bilirubin 0.4 0.3 - 1.2 mg/dL   GFR calc non Af Amer >60 >60 mL/min   GFR calc Af Amer >60 >60 mL/min    Comment: (NOTE) The eGFR has been calculated using the CKD EPI equation. This calculation has not been validated in all clinical situations. eGFR's persistently <60 mL/min signify possible Chronic Kidney Disease.    Anion gap 8 5 - 15  Ethanol     Status: None   Collection Time: 06/30/16  9:42 PM  Result Value Ref Range   Alcohol, Ethyl (B) <5 <5 mg/dL    Comment:        LOWEST DETECTABLE LIMIT  FOR SERUM ALCOHOL IS 5 mg/dL FOR MEDICAL PURPOSES ONLY   Salicylate level     Status: None   Collection Time: 06/30/16  9:42 PM  Result Value Ref Range   Salicylate Lvl <8.3 2.8 - 30.0 mg/dL  Acetaminophen level     Status: Abnormal   Collection Time: 06/30/16  9:42 PM  Result Value Ref Range   Acetaminophen (Tylenol), Serum <10 (L) 10 - 30 ug/mL    Comment:        THERAPEUTIC CONCENTRATIONS VARY SIGNIFICANTLY. A RANGE OF 10-30 ug/mL MAY BE AN EFFECTIVE CONCENTRATION FOR MANY PATIENTS. HOWEVER, SOME ARE BEST TREATED AT CONCENTRATIONS OUTSIDE THIS RANGE. ACETAMINOPHEN CONCENTRATIONS >150 ug/mL AT 4 HOURS AFTER INGESTION AND >50 ug/mL AT 12 HOURS AFTER INGESTION ARE OFTEN ASSOCIATED WITH TOXIC REACTIONS.   cbc     Status: Abnormal   Collection Time: 06/30/16  9:42 PM  Result Value Ref Range   WBC 10.0 3.8 - 10.6 K/uL   RBC 4.40 4.40 - 5.90 MIL/uL   Hemoglobin 13.5 13.0 - 18.0 g/dL   HCT 39.3 (L) 40.0 - 52.0 %   MCV 89.5 80.0 - 100.0 fL   MCH 30.6 26.0 - 34.0 pg   MCHC 34.2 32.0 - 36.0 g/dL   RDW 13.3 11.5 - 14.5 %   Platelets 188 150 - 440 K/uL  Urine Drug Screen, Qualitative     Status: Abnormal   Collection Time: 06/30/16  9:42 PM  Result Value Ref Range   Tricyclic, Ur Screen NONE DETECTED NONE DETECTED   Amphetamines, Ur Screen NONE DETECTED NONE DETECTED   MDMA (Ecstasy)Ur Screen NONE DETECTED NONE DETECTED   Cocaine Metabolite,Ur [place] POSITIVE (A) NONE DETECTED   Opiate, Ur Screen POSITIVE (A) NONE DETECTED   Phencyclidine (PCP) Ur S NONE DETECTED NONE DETECTED   Cannabinoid 50 Ng, Ur [place] NONE DETECTED NONE DETECTED   Barbiturates, Ur Screen NONE DETECTED NONE DETECTED   Benzodiazepine, Ur Scrn NONE DETECTED NONE DETECTED   Methadone Scn, Ur NONE DETECTED NONE DETECTED    Comment: (NOTE) 015  Tricyclics, urine               Cutoff 1000 ng/mL 200  Amphetamines, urine             Cutoff 1000 ng/mL 300  MDMA (Ecstasy), urine           Cutoff 500 ng/mL 400   Cocaine Metabolite, urine       Cutoff 300 ng/mL 500  Opiate, urine                   Cutoff 300 ng/mL 600  Phencyclidine (PCP), urine      Cutoff 25 ng/mL 700  Cannabinoid, urine              Cutoff 50 ng/mL 800  Barbiturates, urine             Cutoff 200 ng/mL 900  Benzodiazepine, urine           Cutoff 200 ng/mL 1000 Methadone, urine                Cutoff 300 ng/mL 1100 1200 The urine drug screen provides only a preliminary, unconfirmed 1300 analytical test result and should not be used for non-medical 1400 purposes. Clinical consideration and professional judgment should 1500 be applied to any positive drug screen result due to possible 1600 interfering substances. A more specific alternate chemical method 1700 must be used in order to obtain a confirmed analytical result.  1800 Gas chromato graphy / mass spectrometry (GC/MS) is the preferred 1900 confirmatory method.     Current Facility-Administered Medications  Medication Dose Route Frequency Provider Last Rate Last Dose  . lidocaine (LIDODERM) 5 % 1 patch  1 patch Transdermal Q24H Loney Hering, MD   1 patch at 07/01/16 0251   Current Outpatient Prescriptions  Medication Sig Dispense Refill  . benzonatate (TESSALON) 100 MG capsule Take 1 capsule (100 mg total) by mouth every 8 (eight) hours. 21 capsule 0  . cephALEXin (KEFLEX) 500 MG capsule Take 1 capsule (500 mg total) by mouth 3 (three) times daily. 30 capsule 0  . cyclobenzaprine (FLEXERIL) 10 MG tablet Take 1 tablet (10 mg total) by mouth 2 (two) times daily as needed for muscle spasms. 20 tablet 0  . HYDROcodone-acetaminophen (NORCO/VICODIN) 5-325 MG per tablet 1 to  2 tabs every 4 to 6 hours as needed for pain. 20 tablet 0  . methocarbamol (ROBAXIN) 500 MG tablet Take 1 tablet (500 mg total) by mouth 2 (two) times daily as needed for muscle spasms. 20 tablet 0  . naproxen (NAPROSYN) 500 MG tablet Take 1 tablet (500 mg total) by mouth 2 (two) times daily with a meal.  30 tablet 0  . predniSONE (DELTASONE) 20 MG tablet Take 2 tablets (40 mg total) by mouth daily. Take 40 mg by mouth daily for 3 days, then 69m by mouth daily for 3 days, then 119mdaily for 3 days 12 tablet 0  . traMADol (ULTRAM) 50 MG tablet Take 1 tablet (50 mg total) by mouth every 6 (six) hours as needed. 20 tablet 0    Musculoskeletal: Strength & Muscle Tone: within normal limits Gait & Station: normal Patient leans: N/A  Psychiatric Specialty Exam: Physical Exam  Nursing note and vitals reviewed. Constitutional: He appears well-developed and well-nourished.  HENT:  Head: Normocephalic and atraumatic.  Eyes: Conjunctivae are normal. Pupils are equal, round, and reactive to light.  Neck: Normal range of motion.  Cardiovascular: Regular rhythm and normal heart sounds.   Respiratory: Effort normal. No respiratory distress.  GI: Soft.  Musculoskeletal: Normal range of motion.  Neurological: He is alert.  Skin: Skin is warm and dry.  Psychiatric: His affect is blunt. His speech is delayed. He is slowed. Thought content is not paranoid. He expresses impulsivity. He expresses no homicidal and no suicidal ideation. He exhibits abnormal recent memory.    Review of Systems  Constitutional: Negative.   HENT: Negative.   Eyes: Negative.   Respiratory: Negative.   Cardiovascular: Negative.   Gastrointestinal: Negative.   Musculoskeletal: Negative.   Skin: Negative.   Neurological: Negative.   Psychiatric/Behavioral: Negative for depression, hallucinations, memory loss, substance abuse and suicidal ideas. The patient is not nervous/anxious and does not have insomnia.     Blood pressure 121/70, pulse 67, temperature 97.9 F (36.6 C), temperature source Oral, resp. rate 18, weight 74.8 kg (165 lb), SpO2 100 %.Body mass index is 28.32 kg/m.  General Appearance: Casual  Eye Contact:  Fair  Speech:  Slow  Volume:  Decreased  Mood:  Euthymic  Affect:  Constricted  Thought Process:   Goal Directed  Orientation:  Full (Time, Place, and Person)  Thought Content:  Logical  Suicidal Thoughts:  No  Homicidal Thoughts:  No  Memory:  Immediate;   Good Recent;   Fair Remote;   Fair  Judgement:  Impaired  Insight:  Lacking  Psychomotor Activity:  Normal  Concentration:  Concentration: Fair  Recall:  [employer] Knowledge:  Fair  Language:  Fair  Akathisia:  No  Handed:  Right  AIMS (if indicated):     Assets:  Housing Physical Health  ADL's:  Intact  Cognition:  WNL  Sleep:        Treatment Plan Summary: Plan [age]ear old man came into the emergency room with reports that he had asked police to shoot him. He had a drug screen positive for cocaine and opiates. Patient is now minimizing or denying everything about the admission and insist that there was absolutely no suicidal thought involved at all. Totally denies any suicidal wishes now. Denies psychiatric symptoms. Minimizes and is evasive about substance abuse issues. He is requesting to be discharged. At this point he appears to be not meeting commitment criteria. Patient was counseled about the availability of substance abuse  treatment in the community and to consider that if substance use is making his mood worse perhaps he should make some effort to change. Otherwise he will be taken off commitment case reviewed with emergency room physician and TTS. Patient can be discharged from the emergency room.  Disposition: Patient does not meet criteria for psychiatric inpatient admission. Supportive therapy provided about ongoing stressors.  Alethia Berthold, MD 07/01/2016 2:28 PM

## 2016-07-01 NOTE — ED Notes (Signed)
Patient became cooperative. Pt denies SI/HI/AVH. Patient states, "Old ass man and 36 year old woman said I was on tracks and I was beside the tracks laying in the sun. And I don't won't to hurt myself, I just said what I said joking I didn't mean it." Patient made aware of camera and Q15 mins checks.

## 2016-07-01 NOTE — ED Notes (Signed)
Patient loud coming to unit. Patient upset he is here in [facility]. Patient refusing to answer questions stating, "I am in jail, why am I in jail?"  This writer advised patient this was the behavioral health unit and he is here until he sees the doctor later this morning. Patient upset he is here saying he is going to loose his job.  This Clinical research associatewriter let patient use phone to make phone call. Patient currently sitting in bed.

## 2016-07-01 NOTE — BH Assessment (Signed)
Assessment Note  Billy Miles is an 36 y.o. male who presents to the ER via law enforcement under IVC. Per the IVC, patient [place] the [place] tracks and when Patent examiner approached him, he told the officer to put a bullet in his head. According the patient, he was not laying on the tracks; he was sitting down beside them. When law enforcement arrived, "they told me somebody called and said they saw me on the tracks. The person who called had been drinking and they believed him." When writer asked, why was he near the tracks, he stated he was in his backyard cleaning up and got tired. Thus, he sat down and it happened to be near the railroad tracks. "The tracks are right there in my back yard. My property is next to the tracks."  Per the report of the patient's girlfriend (Crystal-[phone]), she have noticed change in his behavior, within the last two weeks. They have known each other for approximately seven months and recently moved in together. His sleep has decreased and he's been "acting strange."  She was unaware the patient was near the tracks. She found out when law enforcement arrived. "They knocked on the door and asked me what was he on (drugs). I guess they thought he was high (impaired), he was looking crazy." Girlfriend states, she hasn't seen him like this before. This morning n(07/01/2016), she contacted patient's mother and sister about his behaviors. Patient's mother informed her; he acts like this when he is using drugs. Per the report of the girlfriend, she had no knowledge of the patient's history of drug use.  During the interview, the patient was calm, cooperative and pleasant. He voice his frustration about missing work because he was in the ER. He states, he was brought to the ER because some drunk person called the police.  Patient denies SI/HI and AV/H. When asked about his substance use, patient minimized it. He denies involvement with the legal system. He also  denies having a history of aggression and violence.  Past Medical History: History reviewed. No pertinent past medical history.  Past Surgical History:  Procedure Laterality Date  . KNEE SURGERY      Family History: History reviewed. No pertinent family history.  Social History:  reports that he has been smoking Cigarettes.  He has been smoking about 1.00 pack per day. He has never used smokeless tobacco. He reports that he drinks alcohol. He reports that he uses drugs, including Cocaine and Marijuana.  Additional Social History:  Alcohol / Drug Use Pain Medications: See PTA Prescriptions: See PTA Over the Counter: See PTA History of alcohol / drug use?: Yes Longest period of sobriety (when/how long): Unable to quantify Negative Consequences of Use: Personal relationships (Per Girlfriend) Withdrawal Symptoms:  (Unknown) Substance #1 Name of Substance 1: Cocaine 1 - Amount (size/oz): "I don't use like that." Unable to quantify 1 - Frequency: "I don't use like that." Unable to quantify 1 - Last Use / Amount: "The other day" Substance #2 Name of Substance 2: Alcohol 2 - Amount (size/oz): "I don't use like that." Unable to quantify 2 - Frequency: "I don't use like that." Unable to quantify 2 - Last Use / Amount: "The other day" Substance #3 Name of Substance 3: Opioids 3 - Amount (size/oz): Unable to quantify 3 - Frequency: Unable to quantify 3 - Last Use / Amount: Patient denies use  CIWA: CIWA-Ar BP: 121/70 Pulse Rate: 67 Nausea and Vomiting: no nausea and  no vomiting Tactile Disturbances: none Tremor: no tremor Auditory Disturbances: not present Paroxysmal Sweats: no sweat visible Visual Disturbances: not present Anxiety: mildly anxious Headache, Fullness in Head: none present Agitation: normal activity Orientation and Clouding of Sensorium: oriented and can do serial additions CIWA-Ar Total: 1 COWS: Clinical Opiate Withdrawal Scale (COWS) Resting Pulse Rate: Pulse  Rate 81-100 Sweating: No report of chills or flushing Restlessness: Able to sit still Pupil Size: Pupils pinned or normal size for room light Bone or Joint Aches: Not present Runny Nose or Tearing: Not present GI Upset: No GI symptoms Tremor: No tremor Yawning: No yawning Anxiety or Irritability: None Gooseflesh Skin: Skin is smooth COWS Total Score: 1  Allergies: No Known Allergies  Home Medications:  (Not in a hospital admission)  OB/GYN Status:  No LMP for male patient.  General Assessment Data [facility] Assessment: In system Is this a Tele or Face-to-Face Assessment?: Face-to-Face Is this an Initial Assessment or a Re-assessment for this encounter?: Initial Assessment Marital status: Single Maiden name: n/a Is patient pregnant?: No Pregnancy Status: No Living Arrangements: Spouse/significant other (Recently moved in with girlfriend) Can pt return to current living arrangement?: Yes Admission Status: Involuntary Is patient capable of signing voluntary admission?: No (Under IVC) Referral Source: Self/Family/Friend Insurance type: None  Medical Screening Exam [facility] Walk-in ONLY) Medical Exam completed: Yes  Crisis Care Plan Living Arrangements: Spouse/significant other (Recently moved in with girlfriend) Legal Guardian: Other: (Self) Name of Psychiatrist: Reports of none Name of Therapist: Reports of none  Education Status Is patient currently in school?: No Current Grade: n/a Highest grade of school patient has completed: n/a Name of school: n/a Contact person: n/a  Risk to self with the past 6 months Suicidal Ideation: No (Patient denies) Has patient been a risk to self within the past 6 months prior to admission? : No (Patient denies) Suicidal Intent: No (Patient denies) Has patient had any suicidal intent within the past 6 months prior to admission? : No (Patient denies) Is patient at risk for suicide?: No (Patient  denies) Suicidal Plan?: No-Not Currently/Within Last 6 Months (Patient denies) Has patient had any suicidal plan within the past 6 months prior to admission? : No (Patient denies) Access to Means: No (Patient denies) What has been your use of drugs/alcohol within the last 12 months?: Alcohol & Cocaine Previous Attempts/Gestures: No How many times?: 0 Other Self Harm Risks: Active Addiction Triggers for Past Attempts: None known Intentional Self Injurious Behavior: None Family Suicide History: Unknown Recent stressful life event(s): Other (Comment) (Reports of none) Persecutory voices/beliefs?: No Depression: No Depression Symptoms: Feeling angry/irritable Substance abuse history and/or treatment for substance abuse?: Yes Suicide prevention information given to non-admitted patients: Not applicable  Risk to Others within the past 6 months Homicidal Ideation: No Does patient have any lifetime risk of violence toward others beyond the six months prior to admission? : No Thoughts of Harm to Others: No Current Homicidal Intent: No Current Homicidal Plan: No Access to Homicidal Means: No Identified Victim: Reports of none History of harm to others?: No Assessment of Violence: None Noted Violent Behavior Description: Reports of none Does patient have access to weapons?: No Criminal Charges Pending?: No Does patient have a court date: No Is patient on probation?: No  Psychosis Hallucinations: None noted Delusions: None noted  Mental Status Report Appearance/Hygiene: Unremarkable, In scrubs Eye Contact: Good Motor Activity: Freedom of movement, Unremarkable Speech: Logical/coherent Level of Consciousness: Alert Mood: Anxious, Irritable, Pleasant Affect:  Appropriate to circumstance Anxiety Level: Minimal Thought Processes: Coherent, Relevant Judgement: Unimpaired Orientation: Person, Place, Time, Situation, Appropriate for developmental age Obsessive Compulsive  Thoughts/Behaviors: Minimal  Cognitive Functioning Concentration: Normal Memory: Recent Intact, Remote Intact IQ: Average Insight: Fair Impulse Control: Fair Appetite: Good Weight Loss: 0 Weight Gain: 0 Sleep: No Change Total Hours of Sleep: 8 Vegetative Symptoms: None  ADLScreening [facility]) Patient's cognitive ability adequate to safely complete daily activities?: Yes Patient able to express need for assistance with ADLs?: Yes Independently performs ADLs?: Yes (appropriate for developmental age)  Prior Inpatient Therapy Prior Inpatient Therapy: No Prior Therapy Dates: Reports of none Prior Therapy Facilty/Provider(s): Reports of none Reason for Treatment: Reports of none  Prior Outpatient Therapy Prior Outpatient Therapy: No Prior Therapy Dates: Reports of none Prior Therapy Facilty/Provider(s): Reports of none Reason for Treatment: Reports of none Does patient have an ACCT team?: No Does patient have Intensive In-House Services?  : No Does patient have [facility] services? : No Does patient have P4CC services?: No  ADL Screening (condition at time of admission) Patient's cognitive ability adequate to safely complete daily activities?: Yes Is the patient deaf or have difficulty hearing?: No Does the patient have difficulty seeing, even when wearing glasses/contacts?: No Does the patient have difficulty concentrating, remembering, or making decisions?: No Patient able to express need for assistance with ADLs?: Yes Does the patient have difficulty dressing or bathing?: No Independently performs ADLs?: Yes (appropriate for developmental age) Does the patient have difficulty walking or climbing stairs?: No Weakness of Legs: None Weakness of Arms/Hands: None  Home Assistive Devices/Equipment Home Assistive Devices/Equipment: None  Therapy Consults (therapy consults require a physician order) PT Evaluation Needed: No OT Evalulation Needed: No SLP  Evaluation Needed: No Abuse/Neglect Assessment (Assessment to be complete while patient is alone) Physical Abuse: Denies Verbal Abuse: Denies Sexual Abuse: Denies Exploitation of patient/patient's resources: Denies Self-Neglect: Denies Values / Beliefs Cultural Requests During Hospitalization: None Spiritual Requests During Hospitalization: None Consults Spiritual Care Consult Needed: No Social Work Consult Needed: No      Additional Information 1:1 In Past 12 Months?: No CIRT Risk: No Elopement Risk: No Does patient have medical clearance?: Yes  Child/Adolescent Assessment Running Away Risk: Denies (Patient is an adult)  Disposition:  Disposition Initial Assessment Completed for this Encounter: Yes Disposition of Patient: Other dispositions (ER MD Ordered Psych Consult)  On Site Evaluation by:   Reviewed with Physician:    Lilyan Gilford MS, LCAS, LPC, NCC, CCSI Therapeutic Triage Specialist 07/01/2016 12:45 PM

## 2016-07-01 NOTE — ED Notes (Signed)
[facility]. [facility] m

## 2016-07-01 NOTE — ED Provider Notes (Signed)
Dr. Toni Amendlapacs recommends discharge.   Merrily Brittleifenbark, Ruthy Forry, MD 07/01/16 1438

## 2016-07-01 NOTE — BH Assessment (Addendum)
Writer spoke with patient's girlfriend to obtain collateral information.

## 2016-08-09 ENCOUNTER — Emergency Department
Admission: EM | Admit: 2016-08-09 | Discharge: 2016-08-10 | Disposition: A | Discharge status: Home / Self Care | Payer: Self-pay | Attending: Emergency Medicine | Admitting: Emergency Medicine

## 2016-08-09 DIAGNOSIS — F1910 Other psychoactive substance abuse, uncomplicated: Secondary | ICD-10-CM | POA: Insufficient documentation

## 2016-08-09 DIAGNOSIS — F17210 Nicotine dependence, cigarettes, uncomplicated: Secondary | ICD-10-CM | POA: Insufficient documentation

## 2016-08-09 DIAGNOSIS — Z791 Long term (current) use of non-steroidal anti-inflammatories (NSAID): Secondary | ICD-10-CM | POA: Insufficient documentation

## 2016-08-09 MED ORDER — LORAZEPAM 2 MG/ML IJ SOLN
2.0000 mg | Freq: Once | INTRAMUSCULAR | Status: AC
  Administered 2016-08-09: 2 mg via INTRAVENOUS

## 2016-08-09 MED ORDER — HALOPERIDOL LACTATE 5 MG/ML IJ SOLN
5.0000 mg | Freq: Once | INTRAMUSCULAR | Status: AC
  Administered 2016-08-09: 5 mg via INTRAMUSCULAR

## 2016-08-09 MED ORDER — DIPHENHYDRAMINE HCL 50 MG/ML IJ SOLN
50.0000 mg | Freq: Once | INTRAMUSCULAR | Status: AC
  Administered 2016-08-09: 50 mg via INTRAMUSCULAR

## 2016-08-09 NOTE — ED Notes (Signed)
Pt in handcuffs with [place] [employer] in rm 20.

## 2016-08-10 LAB — BASIC METABOLIC PANEL
ANION GAP: 7 (ref 5–15)
BUN: 18 mg/dL (ref 6–20)
CALCIUM: 9.2 mg/dL (ref 8.9–10.3)
CO2: 29 mmol/L (ref 22–32)
CREATININE: 1.22 mg/dL (ref 0.61–1.24)
Chloride: 103 mmol/L (ref 101–111)
GLUCOSE: 90 mg/dL (ref 65–99)
Potassium: 3.8 mmol/L (ref 3.5–5.1)
Sodium: 139 mmol/L (ref 135–145)

## 2016-08-10 LAB — URINE DRUG SCREEN, QUALITATIVE (ARMC ONLY)
AMPHETAMINES, UR SCREEN: NOT DETECTED
BARBITURATES, UR SCREEN: NOT DETECTED
Benzodiazepine, Ur Scrn: NOT DETECTED
COCAINE METABOLITE, UR ~~LOC~~: NOT DETECTED
Cannabinoid 50 Ng, Ur ~~LOC~~: NOT DETECTED
MDMA (ECSTASY) UR SCREEN: NOT DETECTED
METHADONE SCREEN, URINE: POSITIVE — AB
OPIATE, UR SCREEN: NOT DETECTED
Phencyclidine (PCP) Ur S: NOT DETECTED
Tricyclic, Ur Screen: NOT DETECTED

## 2016-08-10 LAB — URINALYSIS, COMPLETE (UACMP) WITH MICROSCOPIC
BILIRUBIN URINE: NEGATIVE
GLUCOSE, UA: NEGATIVE mg/dL
KETONES UR: 5 mg/dL — AB
LEUKOCYTES UA: NEGATIVE
Nitrite: NEGATIVE
PH: 5 (ref 5.0–8.0)
PROTEIN: 30 mg/dL — AB
Specific Gravity, Urine: 1.03 (ref 1.005–1.030)

## 2016-08-10 LAB — CBC WITH DIFFERENTIAL/PLATELET
BASOS ABS: 0.1 10*3/uL (ref 0–0.1)
BASOS PCT: 0 %
EOS PCT: 0 %
Eosinophils Absolute: 0.1 10*3/uL (ref 0–0.7)
HCT: 40.3 % (ref 40.0–52.0)
Hemoglobin: 13.4 g/dL (ref 13.0–18.0)
Lymphocytes Relative: 7 %
Lymphs Abs: 1.4 10*3/uL (ref 1.0–3.6)
MCH: 29.2 pg (ref 26.0–34.0)
MCHC: 33.2 g/dL (ref 32.0–36.0)
MCV: 87.9 fL (ref 80.0–100.0)
MONO ABS: 1 10*3/uL (ref 0.2–1.0)
MONOS PCT: 5 %
Neutro Abs: 17.2 10*3/uL — ABNORMAL HIGH (ref 1.4–6.5)
Neutrophils Relative %: 88 %
PLATELETS: 157 10*3/uL (ref 150–440)
RBC: 4.59 MIL/uL (ref 4.40–5.90)
RDW: 14.1 % (ref 11.5–14.5)
WBC: 19.7 10*3/uL — ABNORMAL HIGH (ref 3.8–10.6)

## 2016-08-10 LAB — ACETAMINOPHEN LEVEL: Acetaminophen (Tylenol), Serum: <10 ug/mL — ABNORMAL LOW (ref 10–30)

## 2016-08-10 LAB — SALICYLATE LEVEL: Salicylate Lvl: <7 mg/dL (ref 2.8–30.0)

## 2016-08-10 LAB — ETHANOL: Alcohol, Ethyl (B): <5 mg/dL (ref <5)

## 2016-08-10 NOTE — ED Provider Notes (Signed)
I reevaluated the patient around 7:45 AM, he is awake alert and cooperative. He states that he thinks was probably sleepwalking last night. In any case I did indicate to him that his polysubstance abuse let him to be fairly psychotic and agitated, and he apologized. I did offer him outpatient resources.  He states that he is having no suicidal or homicidal thoughts. He is not having psychosis. I was able to release his emergency involuntary commitment and he will be released with a referral to [facility] and primary care.   Governor RooksLord, Rafik Koppel, MD 08/10/16 [phone]

## 2016-08-10 NOTE — Discharge Instructions (Addendum)
Return to the ER for any worsening condition or thoughts of wanting to hurt yourself or others.

## 2016-08-10 NOTE — ED Notes (Signed)
Pt upset that cell phone not in belongings bag. Note in chart states that pt had cell phone holder. No mention of cell phone. Pt upset but agreed to go home and look for cell. Told pt to call pt relations if he had questions. Pt refused to sign discharge paperwork.

## 2016-08-10 NOTE — ED Notes (Signed)
Pt resting on right side, pt denies wanting drink at this time.

## 2016-08-10 NOTE — ED Notes (Signed)
Pt sleeping at this time, pt lying on back with equal and unlabored resp noted.

## 2016-08-10 NOTE — ED Notes (Signed)
Pt lying on his right side with equal rise and fall of chest noted.

## 2016-08-10 NOTE — ED Notes (Signed)
Pt's belongings placed in bag and labeled with clothes, shoes, cell holder.

## 2016-08-10 NOTE — ED Provider Notes (Signed)
[facility] Emergency Department Provider Note   First MD Initiated Contact with Patient 08/09/16 2345     (approximate)  I have reviewed the triage vital signs and the nursing notes.   HISTORY  Chief Complaint Psychiatric Evaluation    HPI Billy Miles is a 36 y.o. male presents in [place] [employer] custody. Patient was found on the side of the road "acting belligerent with very bizarre behavior". Police officer at bedside stated that the sentinel office to obtain involuntary commitment papers at the knee and the patient to be a risk to himself and to the public at large secondary to his bizarre behavior. Patient very erratic with nonsensical speech occasionally yelling at the staff. Patient does admit to "taking a couple Percocets" before arrival   History reviewed. No pertinent past medical history.  Patient Active Problem List   Diagnosis Date Noted  . Substance induced mood disorder (HCC) 07/01/2016  . Opiate abuse, episodic 07/01/2016  . Cocaine abuse 07/01/2016    Past Surgical History:  Procedure Laterality Date  . KNEE SURGERY      Prior to Admission medications   Medication Sig Start Date End Date Taking? Authorizing Provider  benzonatate (TESSALON) 100 MG capsule Take 1 capsule (100 mg total) by mouth every 8 (eight) hours. 11/07/14   Cartner, Sharlet Salina, PA-C  cephALEXin (KEFLEX) 500 MG capsule Take 1 capsule (500 mg total) by mouth 3 (three) times daily. 09/16/13   Reuben Likes, MD  cyclobenzaprine (FLEXERIL) 10 MG tablet Take 1 tablet (10 mg total) by mouth 2 (two) times daily as needed for muscle spasms. 02/18/15   Linwood Dibbles, MD  HYDROcodone-acetaminophen (NORCO/VICODIN) 5-325 MG per tablet 1 to 2 tabs every 4 to 6 hours as needed for pain. 09/16/13   Reuben Likes, MD  methocarbamol (ROBAXIN) 500 MG tablet Take 1 tablet (500 mg total) by mouth 2 (two) times daily as needed for muscle spasms. 11/13/15   Everlene Farrier,  PA-C  naproxen (NAPROSYN) 500 MG tablet Take 1 tablet (500 mg total) by mouth 2 (two) times daily with a meal. 11/13/15   Everlene Farrier, PA-C  predniSONE (DELTASONE) 20 MG tablet Take 2 tablets (40 mg total) by mouth daily. Take 40 mg by mouth daily for 3 days, then 20mg  by mouth daily for 3 days, then 10mg  daily for 3 days 01/21/14   Emilia Beck, PA-C  traMADol (ULTRAM) 50 MG tablet Take 1 tablet (50 mg total) by mouth every 6 (six) hours as needed. 02/18/15   Linwood Dibbles, MD    Allergies No known drug allergies  Family history None   Social History Social History  Substance Use Topics  . Smoking status: Current Every Day Smoker    Packs/day: 1.00    Types: Cigarettes  . Smokeless tobacco: Never Used  . Alcohol use Yes    Review of Systems Constitutional: No fever/chills Eyes: No visual changes. ENT: No sore throat. Cardiovascular: Denies chest pain. Respiratory: Denies shortness of breath. Gastrointestinal: No abdominal pain.  No nausea, no vomiting.  No diarrhea.  No constipation. Genitourinary: Negative for dysuria. Musculoskeletal: Negative for neck pain.  Negative for back pain. Integumentary: Negative for rash. Neurological: Negative for headaches, focal weakness or numbness. Psychiatric:Positive for substance abuse tonight, bizarre behavior, combative erratic   ____________________________________________   PHYSICAL EXAM:  VITAL SIGNS: ED Triage Vitals  Enc Vitals Group     BP 08/10/16 0030 (!) 130/94     Pulse Rate 08/10/16  0030 (!) 119     Resp 08/10/16 0030 20     Temp 08/10/16 0030 99.4 F (37.4 C)     Temp Source 08/10/16 0030 Oral     SpO2 08/10/16 0030 99 %     Weight 08/10/16 0031 74.8 kg (165 lb)     Height 08/10/16 0031 1.626 m (5\' 4" )     Head Circumference --      Peak Flow --      Pain Score --      Pain Loc --      Pain Edu? --      Excl. in GC? --     Constitutional: Alert, Combative very erratic and bizarre behavior  threatening gestures towards police officers and staff. Appears intoxicated Eyes: Conjunctivae are normal.  Head: Atraumatic. Mouth/Throat: Mucous membranes are moist.  Oropharynx non-erythematous. Neck: No stridor.   Cardiovascular: Tachycardia, regular rhythm. Good peripheral circulation. Grossly normal heart sounds. Respiratory: Normal respiratory effort.  No retractions. Lungs CTAB. Gastrointestinal: Soft and nontender. No distention.  Musculoskeletal: No lower extremity tenderness nor edema. No gross deformities of extremities. Neurologic:  Normal speech and language. No gross focal neurologic deficits are appreciated.  Skin:  Skin is warm, dry and intact. No rash noted. Psychiatric: Very bizarre behavior, erratic threatening gestures staff  ____________________________________________   LABS (all labs ordered are listed, but only abnormal results are displayed)  Labs Reviewed  URINE DRUG SCREEN, QUALITATIVE ([facility] ONLY) - Abnormal; Notable for the following:       Result Value   Methadone Scn, Ur POSITIVE (*)    All other components within normal limits  URINALYSIS, COMPLETE (UACMP) WITH MICROSCOPIC - Abnormal; Notable for the following:    Color, Urine AMBER (*)    APPearance HAZY (*)    Hgb urine dipstick LARGE (*)    Ketones, ur 5 (*)    Protein, ur 30 (*)    Bacteria, UA RARE (*)    Squamous Epithelial / LPF 0-5 (*)    All other components within normal limits  CBC WITH DIFFERENTIAL/PLATELET - Abnormal; Notable for the following:    WBC 19.7 (*)    Neutro Abs 17.2 (*)    All other components within normal limits  ACETAMINOPHEN LEVEL - Abnormal; Notable for the following:    Acetaminophen (Tylenol), Serum <10 (*)    All other components within normal limits  BASIC METABOLIC PANEL  ETHANOL  SALICYLATE LEVEL     Procedures   ____________________________________________   INITIAL IMPRESSION / ASSESSMENT AND PLAN / ED COURSE  Pertinent labs & imaging results  that were available during my care of the patient were reviewed by me and considered in my medical decision making (see chart for details).  Unable to evaluate the patient secondary to very erratic and combative behavior. Also patient with threatening gestures towards police officers and staff. As such patient was chemically sedated with Haldol Benadryl and Ativan. Following administration patient sleep vital signs stable. When patient is alert and oriented no longer bizarre affect I anticipate inpatient management to be discharged home.      ____________________________________________  FINAL CLINICAL IMPRESSION(S) / ED DIAGNOSES  Final diagnoses:  Substance abuse     MEDICATIONS GIVEN DURING THIS VISIT:  Medications  haloperidol lactate (HALDOL) injection 5 mg (5 mg Intramuscular Given 08/09/16 2357)  diphenhydrAMINE (BENADRYL) injection 50 mg (50 mg Intramuscular Given 08/09/16 2356)  LORazepam (ATIVAN) injection 2 mg (2 mg Intravenous Given 08/09/16 2357)     NEW  OUTPATIENT MEDICATIONS STARTED DURING THIS VISIT:  New Prescriptions   No medications on file    Modified Medications   No medications on file    Discontinued Medications   No medications on file     Note:  This document was prepared using Dragon voice recognition software and may include unintentional dictation errors.    Darci Current, MD 08/10/16 [phone]

## 2016-08-10 NOTE — ED Notes (Signed)
Pt changed into behavioral clothing by this tech, Durene CalHunter (RN), Jonette EvaKacey (RN), Wynona Caneshristine (RN) and [place], and [place] at bedside, pt blood and urine obtained by this tech

## 2016-08-10 NOTE — ED Triage Notes (Addendum)
Per [place] by [place] Officer to rm 20 in handcuffs due to being picked up on side of road and "acting belligerent" per officer. Pt speaking loudly at this time and making a hacking sound when talking. Pt states he ingested "percocet" PTA. Pt unable to remain still at this time. EDP in rm on arrival. Per [place] officer IVC in progress.

## 2017-04-07 ENCOUNTER — Other Ambulatory Visit: Payer: Self-pay

## 2017-04-07 ENCOUNTER — Emergency Department
Admission: EM | Admit: 2017-04-07 | Discharge: 2017-04-08 | Disposition: A | Discharge status: Home / Self Care | Payer: Self-pay | Attending: Emergency Medicine | Admitting: Emergency Medicine

## 2017-04-07 DIAGNOSIS — F4325 Adjustment disorder with mixed disturbance of emotions and conduct: Secondary | ICD-10-CM

## 2017-04-07 DIAGNOSIS — F17210 Nicotine dependence, cigarettes, uncomplicated: Secondary | ICD-10-CM | POA: Insufficient documentation

## 2017-04-07 DIAGNOSIS — R4689 Other symptoms and signs involving appearance and behavior: Secondary | ICD-10-CM

## 2017-04-07 DIAGNOSIS — F1994 Other psychoactive substance use, unspecified with psychoactive substance-induced mood disorder: Secondary | ICD-10-CM | POA: Diagnosis present

## 2017-04-07 DIAGNOSIS — F10929 Alcohol use, unspecified with intoxication, unspecified: Secondary | ICD-10-CM | POA: Insufficient documentation

## 2017-04-07 LAB — CBC WITH DIFFERENTIAL/PLATELET
BASOS PCT: 0 %
Basophils Absolute: 0 10*3/uL (ref 0–0.1)
EOS ABS: 0 10*3/uL (ref 0–0.7)
Eosinophils Relative: 0 %
HCT: 39.4 % — ABNORMAL LOW (ref 40.0–52.0)
HEMOGLOBIN: 13.2 g/dL (ref 13.0–18.0)
LYMPHS ABS: 1.2 10*3/uL (ref 1.0–3.6)
Lymphocytes Relative: 8 %
MCH: 29.5 pg (ref 26.0–34.0)
MCHC: 33.4 g/dL (ref 32.0–36.0)
MCV: 88.1 fL (ref 80.0–100.0)
MONO ABS: 0.9 10*3/uL (ref 0.2–1.0)
MONOS PCT: 6 %
NEUTROS PCT: 86 %
Neutro Abs: 12.5 10*3/uL — ABNORMAL HIGH (ref 1.4–6.5)
Platelets: 163 10*3/uL (ref 150–440)
RBC: 4.48 MIL/uL (ref 4.40–5.90)
RDW: 14.1 % (ref 11.5–14.5)
WBC: 14.7 10*3/uL — ABNORMAL HIGH (ref 3.8–10.6)

## 2017-04-07 LAB — BASIC METABOLIC PANEL
ANION GAP: 11 (ref 5–15)
BUN: 12 mg/dL (ref 6–20)
CO2: 23 mmol/L (ref 22–32)
Calcium: 8.5 mg/dL — ABNORMAL LOW (ref 8.9–10.3)
Chloride: 100 mmol/L — ABNORMAL LOW (ref 101–111)
Creatinine, Ser: 1.13 mg/dL (ref 0.61–1.24)
GFR calc Af Amer: >60 mL/min (ref >60)
Glucose, Bld: 82 mg/dL (ref 65–99)
POTASSIUM: 3.1 mmol/L — AB (ref 3.5–5.1)
Sodium: 134 mmol/L — ABNORMAL LOW (ref 135–145)

## 2017-04-07 LAB — ETHANOL: Alcohol, Ethyl (B): <10 mg/dL (ref <10)

## 2017-04-07 MED ORDER — LORAZEPAM 2 MG/ML IJ SOLN
2.0000 mg | Freq: Once | INTRAMUSCULAR | Status: AC
  Administered 2017-04-07: 2 mg via INTRAMUSCULAR

## 2017-04-07 MED ORDER — DIPHENHYDRAMINE HCL 50 MG/ML IJ SOLN
50.0000 mg | Freq: Once | INTRAMUSCULAR | Status: AC
  Administered 2017-04-07: 50 mg via INTRAMUSCULAR

## 2017-04-07 MED ORDER — HALOPERIDOL LACTATE 5 MG/ML IJ SOLN
5.0000 mg | Freq: Once | INTRAMUSCULAR | Status: AC
  Administered 2017-04-07: 5 mg via INTRAMUSCULAR
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
## 2017-04-07 NOTE — ED Notes (Signed)
Per PD, pt has hx of drug abuse (heroin, PCP).
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
## 2017-04-07 NOTE — ED Notes (Signed)
Pt behaving erratically and unable to be assessed properly at this time.  Pt belligerent and screaming at staff.  Dr. Derrill KayGoodman gave VO for haldol 5mg  IM, ativan 2mg  IM, and benadryl 50mg  IM.

## 2017-04-07 NOTE — Discharge Instructions (Addendum)
Please seek medical attention and help for any thoughts about wanting to harm yourself, harm others, any concerning change in behavior, severe depression, inappropriate drug use or any other new or concerning symptoms. ° °

## 2017-04-08 DIAGNOSIS — F4325 Adjustment disorder with mixed disturbance of emotions and conduct: Secondary | ICD-10-CM

## 2017-04-08 LAB — ACETAMINOPHEN LEVEL: Acetaminophen (Tylenol), Serum: <10 ug/mL — ABNORMAL LOW (ref 10–30)

## 2017-04-08 LAB — SALICYLATE LEVEL: Salicylate Lvl: <7 mg/dL (ref 2.8–30.0)

## 2017-04-08 NOTE — ED Notes (Signed)
Pt ambulated to the bathroom to void. Pt refused to give a urin sample. Pt was changed into paper scrubs and his belongings were stored. Pt was angry and loud during the process of changing into hospital attire. Pt was able to calm down and go back to his bed.

## 2017-04-08 NOTE — ED Notes (Signed)
Pt denies SI/HI/AVH. Pt given discharge instructions including prescriptions. Pt states understanding. Pt states receipt of all belongings.   

## 2017-04-08 NOTE — ED Notes (Signed)
MD Clapacs at bedside at this time  

## 2017-04-08 NOTE — ED Notes (Signed)
Pt calling for ride at this time.  °
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
## 2017-04-08 NOTE — ED Notes (Signed)
Breakfast tray given at this time, pt eating, NAD noted

## 2017-04-08 NOTE — ED Notes (Signed)
IVC PAPERS  RESCINDED PER  DR  CLAPACS MD  INFORMED  [place]  RN
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
## 2017-04-08 NOTE — ED Notes (Signed)
Pt given back 1 belongings bag.

## 2017-04-08 NOTE — ED Notes (Signed)
Pt sleeping at this time comfortably. Pt in NAD

## 2017-04-08 NOTE — ED Notes (Signed)
E-signature pad did not pick up pt signature. Pt signed and verbalized acknowledged DC instructions.   Pt given back envelope from security box. Signed to receive envelope back.

## 2017-04-08 NOTE — ED Provider Notes (Signed)
-----------------------------------------   5:55 AM on 04/08/2017 -----------------------------------------   Blood pressure 119/69, pulse (!) 101, resp. rate 18, height 5\' 4"  (1.626 m), weight 76.7 kg (169 lb), SpO2 96 %.  Patient slept overnight.  Ambulated to the restroom to void.  Refused to give a urine specimen.  Refused to change into paper scrubs and got into a verbal altercation with staff and officers.  He was able to be verbally redirected without calming medicines.  Will remain under IVC status pending TTS and psychiatry evaluations.    Irean HongSung, Rease Swinson J, MD 04/08/17 [phone]

## 2017-05-23 ENCOUNTER — Encounter: Payer: Self-pay | Admitting: Emergency Medicine

## 2017-05-23 ENCOUNTER — Emergency Department
Admission: EM | Admit: 2017-05-23 | Discharge: 2017-05-24 | Disposition: A | Discharge status: Home / Self Care | Payer: Self-pay | Attending: Emergency Medicine | Admitting: Emergency Medicine

## 2017-05-23 DIAGNOSIS — F17210 Nicotine dependence, cigarettes, uncomplicated: Secondary | ICD-10-CM | POA: Insufficient documentation

## 2017-05-23 DIAGNOSIS — T50901A Poisoning by unspecified drugs, medicaments and biological substances, accidental (unintentional), initial encounter: Secondary | ICD-10-CM | POA: Insufficient documentation

## 2017-05-23 LAB — CBC WITH DIFFERENTIAL/PLATELET
Basophils Absolute: 0.1 10*3/uL (ref 0–0.1)
Basophils Relative: 1 %
EOS ABS: 0.1 10*3/uL (ref 0–0.7)
Eosinophils Relative: 1 %
HCT: 41.3 % (ref 40.0–52.0)
Hemoglobin: 13.8 g/dL (ref 13.0–18.0)
LYMPHS ABS: 1.7 10*3/uL (ref 1.0–3.6)
LYMPHS PCT: 12 %
MCH: 29.8 pg (ref 26.0–34.0)
MCHC: 33.5 g/dL (ref 32.0–36.0)
MCV: 88.8 fL (ref 80.0–100.0)
MONO ABS: 1.3 10*3/uL — AB (ref 0.2–1.0)
MONOS PCT: 9 %
Neutro Abs: 11.1 10*3/uL — ABNORMAL HIGH (ref 1.4–6.5)
Neutrophils Relative %: 77 %
Platelets: 209 10*3/uL (ref 150–440)
RBC: 4.65 MIL/uL (ref 4.40–5.90)
RDW: 14.5 % (ref 11.5–14.5)
WBC: 14.3 10*3/uL — AB (ref 3.8–10.6)

## 2017-05-23 LAB — COMPREHENSIVE METABOLIC PANEL
ALBUMIN: 4.2 g/dL (ref 3.5–5.0)
ALT: 38 U/L (ref 17–63)
AST: 44 U/L — ABNORMAL HIGH (ref 15–41)
Alkaline Phosphatase: 91 U/L (ref 38–126)
Anion gap: 10 (ref 5–15)
BUN: 24 mg/dL — ABNORMAL HIGH (ref 6–20)
CO2: 26 mmol/L (ref 22–32)
CREATININE: 1.57 mg/dL — AB (ref 0.61–1.24)
Calcium: 9.2 mg/dL (ref 8.9–10.3)
Chloride: 107 mmol/L (ref 101–111)
GFR calc Af Amer: >60 mL/min (ref >60)
GFR calc non Af Amer: 56 mL/min — ABNORMAL LOW (ref >60)
GLUCOSE: 117 mg/dL — AB (ref 65–99)
Potassium: 3.8 mmol/L (ref 3.5–5.1)
SODIUM: 143 mmol/L (ref 135–145)
Total Bilirubin: 0.3 mg/dL (ref 0.3–1.2)
Total Protein: 7.4 g/dL (ref 6.5–8.1)

## 2017-05-23 LAB — CK: Total CK: 455 U/L — ABNORMAL HIGH (ref 49–397)

## 2017-05-23 LAB — TROPONIN I

## 2017-05-23 LAB — ETHANOL: Alcohol, Ethyl (B): <10 mg/dL (ref <10)

## 2017-05-23 MED ORDER — NALOXONE HCL 2 MG/2ML IJ SOSY
PREFILLED_SYRINGE | INTRAMUSCULAR | Status: AC
  Administered 2017-05-23: 0.4 mg via INTRAVENOUS
  Filled 2017-05-23: qty 2

## 2017-05-23 MED ORDER — SODIUM CHLORIDE 0.9 % IV SOLN
Freq: Once | INTRAVENOUS | Status: AC
Start: 2017-05-23 — End: 2017-05-23
  Administered 2017-05-23: 22:00:00 via INTRAVENOUS

## 2017-05-23 MED ORDER — NALOXONE HCL 2 MG/2ML IJ SOSY
0.4000 mg | PREFILLED_SYRINGE | Freq: Once | INTRAMUSCULAR | Status: AC
  Administered 2017-05-23: 0.4 mg via INTRAVENOUS

## 2017-05-23 NOTE — ED Triage Notes (Signed)
Patient brought in by ems.patient found walking in the street. Patient found ams and aggitated. Per ems patient was diaphoretic and hr 150.  Patient given 5 mg haldol and 5 mg versed by ems. Per ems patient know heroin user and a needle found by family.

## 2017-05-23 NOTE — ED Notes (Signed)
Arouses to voice

## 2017-05-23 NOTE — Discharge Instructions (Addendum)
Please follow up with the acute care clinic °

## 2017-05-23 NOTE — ED Provider Notes (Signed)
[facility] Emergency Department Provider Note       Time seen: ----------------------------------------- 10:26 PM on 05/23/2017 ----------------------------------------- Level V caveat: History/ROS limited by altered mental status  I have reviewed the triage vital signs and the nursing notes.  HISTORY   Chief Complaint Drug Overdose    HPI Billy Miles is a 37 y.o. male with a history of adjustment disorder, substance-induced mood disorder, opiate abuse, cocaine abuse who presents to the ED for agitation.  Patient was brought in by EMS after he was found walking in the street.  Patient was found altered and agitated, EMS found that he was tachycardic and was given Haldol and Versed both at 5 mg.  According to EMS he is a known heroin user and a needle was found by the family.  No further information is available  History reviewed. No pertinent past medical history.  Patient Active Problem List   Diagnosis Date Noted  . 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 Substance induced mood disorder ([facility]) 07/01/2016  . Opiate abuse, episodic ([facility]) 07/01/2016  . Cocaine abuse ([facility]) 07/01/2016    Past Surgical History:  Procedure Laterality Date  . KNEE SURGERY      Allergies Patient has no known allergies.  Social History Social History   Tobacco Use  . Smoking status: Current Every Day Smoker    Packs/day: 1.00    Types: Cigarettes  . Smokeless tobacco: Never Used  Substance Use Topics  . Alcohol use: Yes  . Drug use: Yes    Types: Cocaine, Marijuana, IV   Review of Systems Unknown, patient was reportedly agitated and combative prior to arrival  All systems negative/normal/unremarkable except as stated in the HPI  ____________________________________________   PHYSICAL EXAM:  VITAL SIGNS: ED Triage Vitals  Enc Vitals Group     BP 05/23/17 2210 94/69     Pulse Rate 05/23/17 2210 (!) 112   Resp 05/23/17 2210 18     Temp 05/23/17 2215 98.2 F (36.8 C)     Temp src --      SpO2 05/23/17 2210 99 %     Weight 05/23/17 2210 140 lb (63.5 kg)     Height 05/23/17 2210  (1.702 m)     Head Circumference --      Peak Flow --      Pain Score --      Pain Loc --      Pain Edu? --      Excl. in GC? --    Constitutional: Lethargic and poorly responsive, mild distress  Eyes: Pupils pinpoint and equal bilaterally ENT   Head: Normocephalic and atraumatic.   Nose: No congestion/rhinnorhea.   Mouth/Throat: Mucous membranes are moist.   Neck: No stridor. Cardiovascular: Rapid rate, regular rhythm. No murmurs, rubs, or gallops. Respiratory: Normal respiratory effort without tachypnea nor retractions. Breath sounds are clear and equal bilaterally. No wheezes/rales/rhonchi. Gastrointestinal: Soft and nontender. Normal bowel sounds Musculoskeletal: Nontender with normal range of motion in extremities.  Neurologic: Patient localizes to pain but is remarkably lethargic, moving his extremities well. Skin:  Skin is warm, dry and intact. No rash noted. Psychiatric: Mood and affect are normal. Speech and behavior are normal.  ____________________________________________  EKG: Interpreted by me.  Sinus rhythm the rate of 95 bpm, normal PR interval, normal QRS, normal QT.  ____________________________________________  ED COURSE:  As part of my medical decision making, I reviewed the following data within the  electronic MEDICAL RECORD NUMBER History obtained from family if available, nursing notes, old chart and ekg, as well as notes from prior ED visits. Patient presented for agitation and possible overdose, we will assess with labs and imaging as indicated at this time.  Patient was given Narcan with dramatic improvement in his mental status.   Procedures ____________________________________________   LABS (pertinent positives/negatives)  Labs Reviewed  CBC WITH  DIFFERENTIAL/PLATELET - Abnormal; Notable for the following components:      Result Value   WBC 14.3 (*)    Neutro Abs 11.1 (*)    Monocytes Absolute 1.3 (*)    All other components within normal limits  ETHANOL  CK  COMPREHENSIVE METABOLIC PANEL  TROPONIN I  URINALYSIS, COMPLETE (UACMP) WITH MICROSCOPIC  URINE DRUG SCREEN, QUALITATIVE ([facility] ONLY)  CBG MONITORING, ED   ___________________________________________  DIFFERENTIAL DIAGNOSIS   Overdose, substance abuse, intoxication, psychosis  FINAL ASSESSMENT AND PLAN  Accidental drug overdose   Plan: The patient had presented for agitation. Patient's labs are still pending at this time.  Again after Narcan he is much more alert and has been conversing and drinking liquids without any difficulty.  He is still too drowsy to be discharged, we will continue to observe in the ER.   Ulice Dash, MD   Note: This note was generated in part or whole with voice recognition software. Voice recognition is usually quite accurate but there are transcription errors that can and very often do occur. I apologize for any typographical errors that were not detected and corrected.     Emily Filbert, MD 05/23/17 2252

## 2017-05-23 NOTE — ED Notes (Signed)
Pt is A&O x 4 with no memory of what happened to end up in the ED. Pt used heroin today

## 2017-05-24 NOTE — ED Provider Notes (Signed)
-----------------------------------------   1:37 AM on 05/24/2017 -----------------------------------------   Blood pressure 120/63, pulse 81, temperature 98.2 F (36.8 C), resp. rate 11, height  (1.702 m), weight 63.5 kg (140 lb), SpO2 99 %.  Assuming care from Dr. Mayford Knife.  In short, Billy Miles is a 37 y.o. male with a chief complaint of Drug Overdose .  Refer to the original H&P for additional details.  The current plan of care is to reassess the patient once the patient wakes up. The patient states that he is ready to go home. We will have the patient go home with his significant other once she arrives.        Rebecka Apley, MD 05/24/17 [phone]

## 2017-07-19 ENCOUNTER — Emergency Department (HOSPITAL_COMMUNITY)
Admission: EM | Admit: 2017-07-19 | Discharge: 2017-07-19 | Disposition: A | Discharge status: Home / Self Care | Payer: Self-pay | Attending: Emergency Medicine | Admitting: Emergency Medicine

## 2017-07-19 ENCOUNTER — Encounter (HOSPITAL_COMMUNITY): Payer: Self-pay | Admitting: Emergency Medicine

## 2017-07-19 DIAGNOSIS — L02415 Cutaneous abscess of right lower limb: Secondary | ICD-10-CM | POA: Insufficient documentation

## 2017-07-19 DIAGNOSIS — L03115 Cellulitis of right lower limb: Secondary | ICD-10-CM

## 2017-07-19 DIAGNOSIS — Z23 Encounter for immunization: Secondary | ICD-10-CM | POA: Insufficient documentation

## 2017-07-19 DIAGNOSIS — F17210 Nicotine dependence, cigarettes, uncomplicated: Secondary | ICD-10-CM | POA: Insufficient documentation

## 2017-07-19 MED ORDER — LIDOCAINE-EPINEPHRINE (PF) 2 %-1:200000 IJ SOLN
10.0000 mL | Freq: Once | INTRAMUSCULAR | Status: AC
  Administered 2017-07-19: 10 mL
  Filled 2017-07-19: qty 20

## 2017-07-19 MED ORDER — NAPROXEN 375 MG PO TABS: 375.0000 mg | ORAL_TABLET | Freq: Two times a day (BID) | ORAL | 0 refills | Status: DC

## 2017-07-19 MED ORDER — TETANUS-DIPHTH-ACELL PERTUSSIS 5-2.5-18.5 LF-MCG/0.5 IM SUSP
0.5000 mL | Freq: Once | INTRAMUSCULAR | Status: AC
  Administered 2017-07-19: 0.5 mL via INTRAMUSCULAR
  Filled 2017-07-19: qty 0.5

## 2017-07-19 MED ORDER — DOXYCYCLINE HYCLATE 100 MG PO CAPS: 100.0000 mg | ORAL_CAPSULE | Freq: Two times a day (BID) | ORAL | 0 refills | Status: DC

## 2017-07-19 NOTE — ED Triage Notes (Signed)
Pt presents to ED with abscess to right knee, possible spider bite.  Denies known source.

## 2017-07-19 NOTE — ED Notes (Signed)
Patient is A&Ox4 at this time.  Patient in no signs of distress.  Please see providers note for complete history and physical exam.  

## 2017-07-19 NOTE — Discharge Instructions (Addendum)
You were seen in the ER today for an infection of your skin.  This was incised and drained.  We are placing you on antibiotics.  Please take doxycycline as prescribed.  We are also starting you on naproxen to help with pain. Naproxen is a nonsteroidal anti-inflammatory medication that will help with pain and swelling. Be sure to take this medication as prescribed with food, 1 pill every 12 hours,  It should be taken with food, as it can cause stomach upset, and more seriously, stomach bleeding. Do not take other nonsteroidal anti-inflammatory medications with this such as Advil, Motrin, or Aleve.  You may take Tylenol per over-the-counter dosing with this medication safely   We have prescribed you new medication(s) today. Discuss the medications prescribed today with your pharmacist as they can have adverse effects and interactions with your other medicines including over the counter and prescribed medications. Seek medical evaluation if you start to experience new or abnormal symptoms after taking one of these medicines, seek care immediately if you start to experience difficulty breathing, feeling of your throat closing, facial swelling, or rash as these could be indications of a more serious allergic reaction  Apply warm compresses to the area.  Return to the ER go to an urgent care, or see your primary care provider in 2 days for reevaluation of this area.  Return to the ER sooner for spreading redness, increased pain, fever, or any other concerns.

## 2017-07-19 NOTE — ED Provider Notes (Signed)
[facility] EMERGENCY DEPARTMENT Provider Note   CSN: [id number] Arrival date & time: 07/19/17  1959     History   Chief Complaint Chief Complaint  Patient presents with  . Insect Bite    HPI Billy Miles is a 37 y.o. male with a hx of tobacco abuse and polysubstance abuse who presents to the ED with concern for possible spider bite to the R knee x 1 week. Patient did not witness an insect bite. He states he noticed a small raised red area to just below the R knee, this progressively worsened. The area is red, painful, and swollen. He states he inserted a push pin and got some purulent drainage out of it. Rates pain a 6/10 in severity, no specific alleviating/aggravating factors. Has not tried meds at home. Denies fever, chills, or vomiting. Denies numbness, weakness, or trouble with joint mobility. Denies injection drug use at this site.  HPI  History reviewed. No pertinent past medical history.  Patient Active Problem List   Diagnosis Date Noted  . 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 Substance induced mood disorder (HCC) 07/01/2016  . Opiate abuse, episodic (HCC) 07/01/2016  . Cocaine abuse (HCC) 07/01/2016    Past Surgical History:  Procedure Laterality Date  . KNEE SURGERY          Home Medications    Prior to Admission medications   Not on File    Family History History reviewed. No pertinent family history.  Social History Social History   Tobacco Use  . Smoking status: Current Every Day Smoker    Packs/day: 1.00    Types: Cigarettes  . Smokeless tobacco: Never Used  Substance Use Topics  . Alcohol use: Yes  . Drug use: Yes    Types: Cocaine, Marijuana, IV     Allergies   Patient has no known allergies.   Review of Systems Review of Systems  Constitutional: Negative for chills and fever.  Gastrointestinal: Negative for vomiting.  Musculoskeletal: Negative for arthralgias.    Skin: Positive for wound.  Neurological: Negative for weakness and numbness.     Physical Exam Updated Vital Signs BP 121/73 (BP Location: Right Arm)   Pulse 76   Temp 98.5 F (36.9 C) (Oral)   Resp 15   Ht 5\' 4"  (1.626 m)   Wt 69.9 kg (154 lb)   SpO2 97%   BMI 26.43 kg/m   Physical Exam  Constitutional: He appears well-developed and well-nourished. No distress.  HENT:  Head: Normocephalic and atraumatic.  Eyes: Conjunctivae are normal. Right eye exhibits no discharge. Left eye exhibits no discharge.  Cardiovascular:  Pulses:      Dorsalis pedis pulses are 2+ on the right side, and 2+ on the left side.       Posterior tibial pulses are 2+ on the right side, and 2+ on the left side.  Musculoskeletal:  Patient has normal range of motion to bilateral hips, knees, and ankles.  No bony tenderness to palpation.  Neurological: He is alert.  Clear speech.   Skin:  Patient has a 3 cm area of erythema with induration, centrally there is palpable fluctuance.  There is a somewhat ulcerative type lesion to the center of this area.  This area is somewhat tender to palpation.  Pictured below.  Nursing note and vitals reviewed.      ED Treatments / Results  Labs (all labs ordered are listed, but only abnormal results are  displayed) Labs Reviewed - No data to display  EKG None  Radiology No results found.  Procedures EMERGENCY DEPARTMENT US SOFT TISSUE INTERPRETATION "Study: Limited Soft Tissue Ultrasound"  INDICATIONS: Soft tissue infection Multiple views of the body part were obtained in real-time with a multi-frequency linear probe  PERFORMED BY: Myself IMAGES ARCHIVED?: No SIDE:Right  BODY PART:Lower extremity INTERPRETATION:  Questionable small abscess present.    Marland Kitchen..Incision and Drainage Date/Time: 07/19/2017 9:47 PM Performed by: Cherly AndersonPetrucelli, Santonio Speakman R, PA-C Authorized by: Cherly AndersonPetrucelli, Nami Strawder R, PA-C   Consent:    Consent obtained:  Verbal   Consent given  by:  Patient   Risks discussed:  Bleeding, damage to other organs, infection, incomplete drainage and pain   Alternatives discussed:  No treatment and alternative treatment Location:    Type:  Abscess   Size:  3cm diameter induratin, 1cm central fluctuance   Location:  Lower extremity   Lower extremity location:  Knee   Knee location:  R knee Pre-procedure details:    Skin preparation:  Betadine Anesthesia (see MAR for exact dosages):    Anesthesia method:  Local infiltration   Local anesthetic:  Lidocaine 2% WITH epi Procedure type:    Complexity:  Simple Procedure details:    Incision types:  Stab incision   Scalpel blade:  11   Wound management:  Probed and deloculated and irrigated with saline   Drainage:  Bloody and purulent   Drainage amount: mild.   Wound treatment:  Wound left open   Packing materials:  None Post-procedure details:    Patient tolerance of procedure:  Tolerated well, no immediate complications   (including critical care time)  Medications Ordered in ED Medications - No data to display   Initial Impression / Assessment and Plan / ED Course  I have reviewed the triage vital signs and the nursing notes.  Pertinent labs & imaging results that were available during my care of the patient were reviewed by me and considered in my medical decision making (see chart for details).   Patient presents with abscess amenable to I&D. Patient nontoxic appearing, in no apparent distress, vitals WNL, he is afebrile. Good ROM at the knee, doubt deeper space infection such as septic joint. Mild amount of drainage with I&D. Tetanus updated. Given surrounding erythema/induration and only mild drainage with I&D will place on doxycycline. Naproxen for pain. Recommended warm compresses/soaks. Wound recheck in 2 days. I discussed treatment plan, need for follow-up, and return precautions with the patient. Provided opportunity for questions, patient confirmed understanding and is  in agreement with plan.   Findings and plan of care discussed with supervising physician Dr. Ranae PalmsYelverton in agreement.   Final Clinical Impressions(s) / ED Diagnoses   Final diagnoses:  Cellulitis and abscess of right lower extremity    ED Discharge Orders        Ordered    doxycycline (VIBRAMYCIN) 100 MG capsule  2 times daily     07/19/17 2150    naproxen (NAPROSYN) 375 MG tablet  2 times daily     07/19/17 2150       Ryoma Nofziger, Pleas KochSamantha R, PA-C 07/19/17 2330    Loren RacerYelverton, David, MD 07/22/17 1531

## 2018-07-17 ENCOUNTER — Other Ambulatory Visit: Payer: Self-pay

## 2018-07-17 ENCOUNTER — Emergency Department (HOSPITAL_COMMUNITY)
Admission: EM | Admit: 2018-07-17 | Discharge: 2018-07-17 | Disposition: A | Discharge status: Home / Self Care | Payer: Self-pay | Attending: Emergency Medicine | Admitting: Emergency Medicine

## 2018-07-17 ENCOUNTER — Encounter (HOSPITAL_COMMUNITY): Payer: Self-pay | Admitting: Emergency Medicine

## 2018-07-17 DIAGNOSIS — F1290 Cannabis use, unspecified, uncomplicated: Secondary | ICD-10-CM | POA: Insufficient documentation

## 2018-07-17 DIAGNOSIS — F1490 Cocaine use, unspecified, uncomplicated: Secondary | ICD-10-CM | POA: Insufficient documentation

## 2018-07-17 DIAGNOSIS — N179 Acute kidney failure, unspecified: Secondary | ICD-10-CM | POA: Insufficient documentation

## 2018-07-17 DIAGNOSIS — F17210 Nicotine dependence, cigarettes, uncomplicated: Secondary | ICD-10-CM | POA: Insufficient documentation

## 2018-07-17 DIAGNOSIS — E860 Dehydration: Secondary | ICD-10-CM | POA: Insufficient documentation

## 2018-07-17 LAB — BASIC METABOLIC PANEL
Anion gap: 14 (ref 5–15)
BUN: 19 mg/dL (ref 6–20)
CO2: 23 mmol/L (ref 22–32)
Calcium: 8.9 mg/dL (ref 8.9–10.3)
Chloride: 103 mmol/L (ref 98–111)
Creatinine, Ser: 2.24 mg/dL — ABNORMAL HIGH (ref 0.61–1.24)
GFR calc Af Amer: 42 mL/min — ABNORMAL LOW (ref >60)
GFR calc non Af Amer: 36 mL/min — ABNORMAL LOW (ref >60)
Glucose, Bld: 101 mg/dL — ABNORMAL HIGH (ref 70–99)
Potassium: 3.8 mmol/L (ref 3.5–5.1)
Sodium: 140 mmol/L (ref 135–145)

## 2018-07-17 LAB — CK: Total CK: 819 U/L — ABNORMAL HIGH (ref 49–397)

## 2018-07-17 MED ORDER — LACTATED RINGERS IV BOLUS
2000.0000 mL | Freq: Once | INTRAVENOUS | Status: AC
  Administered 2018-07-17: 2000 mL via INTRAVENOUS

## 2018-07-17 NOTE — ED Triage Notes (Signed)
Patient from the street, was seen by EMS x3 before coming to ED.  Patient with bizarre behavior, diaphoretic, tachycardic with some mild hypotension.  Patient is homeless, outside all day.  Admits to history of substance abuse, but denies any at this time.  Initial BP of 98/62, was given 492ml NS bolus and BP came up to 116/86.  No nausea, no vomiting, no chest pain or shortness of breath.  Stated to EMS "I feel like I am going to die".

## 2018-07-17 NOTE — ED Notes (Signed)
EKG taken. Order number not entered.

## 2018-07-17 NOTE — ED Provider Notes (Signed)
Emergency Department Provider Note   I have reviewed the triage vital signs and the nursing notes.   HISTORY  Chief Complaint Near Syncope   HPI Billy Miles is a 38 y.o. male he drinks alcohol and also works outside the presents the emergency department today for an episode of lightheadedness.  Patient apparently was lightheaded diaphoretic, near syncopal when EMS arrived.   Blood pressures of 90s over 60s was also acting little bit bizarre.  No other symptoms besides chronic knee pain and back pain related to his job.  He apparently told EMS he felt like he is going to die but on my evaluation patient states he has no suicidal ideation and just felt very bad at that time.  Patient denies any thoughts of want her anyone else.  Denies hallucinations, delusions and paranoia. Has drank some alcohol today but is ambiguous about the amount.  No other associated or modifying symptoms.    History reviewed. No pertinent past medical history.  Patient Active Problem List   Diagnosis Date Noted  . 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 Substance induced mood disorder (HCC) 07/01/2016  . Opiate abuse, episodic (HCC) 07/01/2016  . Cocaine abuse (HCC) 07/01/2016    Past Surgical History:  Procedure Laterality Date  . KNEE SURGERY        Allergies Patient has no known allergies.  No family history on file.  Social History Social History   Tobacco Use  . Smoking status: Current Every Day Smoker    Packs/day: 1.00    Types: Cigarettes  . Smokeless tobacco: Never Used  Substance Use Topics  . Alcohol use: Yes  . Drug use: Yes    Types: Cocaine, Marijuana, IV    Review of Systems  All other systems negative except as documented in the HPI. All pertinent positives and negatives as reviewed in the HPI. ____________________________________________   PHYSICAL EXAM:  VITAL SIGNS: ED Triage Vitals  Enc Vitals Group     BP  07/17/18 0022 109/66     Pulse Rate 07/17/18 0022 90     Resp 07/17/18 0022 14     Temp 07/17/18 0022 98.2 F (36.8 C)     Temp Source 07/17/18 0022 Oral     SpO2 07/17/18 0022 96 %    Constitutional: Alert and oriented. Well appearing and in no acute distress. Eyes: Conjunctivae are normal. PERRL. EOMI. Head: Atraumatic. Nose: No congestion/rhinnorhea. Mouth/Throat: Mucous membranes are moist.  Oropharynx non-erythematous. Neck: No stridor.  No meningeal signs.   Cardiovascular: Normal rate, regular rhythm. Good peripheral circulation. Grossly normal heart sounds.   Respiratory: Normal respiratory effort.  No retractions. Lungs CTAB. Gastrointestinal: Soft and nontender. No distention.  Musculoskeletal: No lower extremity tenderness nor edema. No gross deformities of extremities. Neurologic:  Normal speech and language. No gross focal neurologic deficits are appreciated.  Skin:  Skin is warm, dry and intact. No rash noted.   ____________________________________________   LABS (all labs ordered are listed, but only abnormal results are displayed)  Labs Reviewed  CK - Abnormal; Notable for the following components:      Result Value   Total CK 819 (*)    All other components within normal limits  BASIC METABOLIC PANEL - Abnormal; Notable for the following components:   Glucose, Bld 101 (*)    Creatinine, Ser 2.24 (*)    GFR calc non Af Amer 36 (*)    GFR calc Af Amer 42 (*)  All other components within normal limits   ____________________________________________  EKG   EKG Interpretation  Date/Time:  Friday July 17 2018 00:24:13 EDT Ventricular Rate:  88 PR Interval:    QRS Duration: 104 QT Interval:  357 QTC Calculation: 432 R Axis:   55 Text Interpretation:  Sinus rhythm No significant change since last tracing Confirmed by Billy Miles [phone]) on 07/17/2018 12:42:28 AM       ____________________________________________   INITIAL IMPRESSION / ASSESSMENT  AND PLAN / ED COURSE  Suspect dehydration and possible related alcohol, working outside and hypovolemia.  He does not admit to any substance abuse this time does have a history of sympathomimetic usage so could be related that.  Labs rehydrate and likely discharge.  Patient with acute kidney injury and a mildly elevated CK.  I doubt this is actually rhabdomyolysis patient's making urine multiple times that has been here and is cleared up.  Patient urged to stay for further hydration and recheck of labs to make sure things were improving however he states he has go to work tomorrow and really wants to leave.  Patient is tolerating p.o. intake multiple cups of fluid here and will continue to do the same at home.  I cautioned against working tomorrow however patient was insistent that he wanted to leave to go to work. Understands risks of leaving and working. Will return if anything worsening but otherwise will continue to hydrate as well as he can.     Pertinent labs & imaging results that were available during my care of the patient were reviewed by me and considered in my medical decision making (see chart for details).  A medical screening exam was performed and I feel the patient has had an appropriate workup for their chief complaint at this time and likelihood of emergent condition existing is low. They have been counseled on decision, discharge, follow up and which symptoms necessitate immediate return to the emergency department. They or their family verbally stated understanding and agreement with plan and discharged in stable condition.   ____________________________________________  FINAL CLINICAL IMPRESSION(S) / ED DIAGNOSES  Final diagnoses:  AKI (acute kidney injury) ([place])  Dehydration     MEDICATIONS GIVEN DURING THIS VISIT:  Medications  lactated ringers bolus 2,000 mL (0 mLs Intravenous Stopped 07/17/18 0313)     NEW OUTPATIENT MEDICATIONS STARTED DURING THIS VISIT:   Discharge Medication List as of 07/17/2018  2:59 AM      Note:  This note was prepared with assistance of Dragon voice recognition software. Occasional wrong-word or sound-a-like substitutions may have occurred due to the inherent limitations of voice recognition software.   Sarayah Bacchi, Corene Cornea, MD 07/17/18 [phone]

## 2018-07-17 NOTE — ED Notes (Signed)
Pt says he has a ride home with his mother.

## 2018-07-17 NOTE — ED Notes (Signed)
Pt yelling and pacing around the room. Asking for food. Meal given per md

## 2019-05-06 ENCOUNTER — Encounter (HOSPITAL_COMMUNITY): Payer: Self-pay | Admitting: *Deleted

## 2019-05-06 ENCOUNTER — Other Ambulatory Visit: Payer: Self-pay

## 2019-05-06 ENCOUNTER — Emergency Department (HOSPITAL_COMMUNITY): Payer: Self-pay

## 2019-05-06 ENCOUNTER — Emergency Department (HOSPITAL_COMMUNITY)
Admission: EM | Admit: 2019-05-06 | Discharge: 2019-05-07 | Disposition: A | Discharge status: Home / Self Care | Payer: Self-pay | Attending: Emergency Medicine | Admitting: Emergency Medicine

## 2019-05-06 DIAGNOSIS — M545 Low back pain: Secondary | ICD-10-CM | POA: Insufficient documentation

## 2019-05-06 DIAGNOSIS — M25561 Pain in right knee: Secondary | ICD-10-CM | POA: Insufficient documentation

## 2019-05-06 DIAGNOSIS — M25562 Pain in left knee: Secondary | ICD-10-CM | POA: Insufficient documentation

## 2019-05-06 DIAGNOSIS — Z5321 Procedure and treatment not carried out due to patient leaving prior to being seen by health care provider: Secondary | ICD-10-CM | POA: Insufficient documentation

## 2019-05-06 HISTORY — DX: Pain in unspecified knee: M25.569

## 2019-05-06 HISTORY — DX: Dorsalgia, unspecified: M54.9

## 2019-05-06 NOTE — ED Triage Notes (Signed)
Pt is here due to chronic back and chronic knee pain.

## 2019-05-06 NOTE — ED Notes (Signed)
Pt did not answer for triage x2

## 2019-05-07 NOTE — ED Notes (Signed)
Pt began walking outside telling staff to have a good night. This tech asked if the pt was leaving, and pt confirmed that he was leaving. Pt was encouraged to stay, but decided to leave

## 2019-05-24 ENCOUNTER — Encounter (HOSPITAL_COMMUNITY): Payer: Self-pay

## 2019-05-24 ENCOUNTER — Other Ambulatory Visit: Payer: Self-pay

## 2019-05-24 ENCOUNTER — Emergency Department (HOSPITAL_COMMUNITY)
Admission: EM | Admit: 2019-05-24 | Discharge: 2019-05-24 | Discharge status: Against Medical Advice | Payer: Self-pay | Attending: Emergency Medicine | Admitting: Emergency Medicine

## 2019-05-24 DIAGNOSIS — R509 Fever, unspecified: Secondary | ICD-10-CM

## 2019-05-24 DIAGNOSIS — Z20822 Contact with and (suspected) exposure to covid-19: Secondary | ICD-10-CM | POA: Insufficient documentation

## 2019-05-24 DIAGNOSIS — F17210 Nicotine dependence, cigarettes, uncomplicated: Secondary | ICD-10-CM | POA: Insufficient documentation

## 2019-05-24 DIAGNOSIS — D72829 Elevated white blood cell count, unspecified: Secondary | ICD-10-CM

## 2019-05-24 DIAGNOSIS — R5383 Other fatigue: Secondary | ICD-10-CM

## 2019-05-24 LAB — CBC WITH DIFFERENTIAL/PLATELET
Abs Immature Granulocytes: 0.08 10*3/uL — ABNORMAL HIGH (ref 0.00–0.07)
Basophils Absolute: 0 10*3/uL (ref 0.0–0.1)
Basophils Relative: 0 %
Eosinophils Absolute: 0 10*3/uL (ref 0.0–0.5)
Eosinophils Relative: 0 %
HCT: 42.1 % (ref 39.0–52.0)
Hemoglobin: 13.5 g/dL (ref 13.0–17.0)
Immature Granulocytes: 1 %
Lymphocytes Relative: 5 %
Lymphs Abs: 0.8 10*3/uL (ref 0.7–4.0)
MCH: 26.9 pg (ref 26.0–34.0)
MCHC: 32.1 g/dL (ref 30.0–36.0)
MCV: 84 fL (ref 80.0–100.0)
Monocytes Absolute: 0.9 10*3/uL (ref 0.1–1.0)
Monocytes Relative: 6 %
Neutro Abs: 14.3 10*3/uL — ABNORMAL HIGH (ref 1.7–7.7)
Neutrophils Relative %: 88 %
Platelets: 213 10*3/uL (ref 150–400)
RBC: 5.01 MIL/uL (ref 4.22–5.81)
RDW: 14.6 % (ref 11.5–15.5)
WBC: 16.2 10*3/uL — ABNORMAL HIGH (ref 4.0–10.5)
nRBC: 0 % (ref 0.0–0.2)

## 2019-05-24 LAB — URINALYSIS, ROUTINE W REFLEX MICROSCOPIC
Bilirubin Urine: NEGATIVE
Glucose, UA: NEGATIVE mg/dL
Hgb urine dipstick: NEGATIVE
Ketones, ur: NEGATIVE mg/dL
Leukocytes,Ua: NEGATIVE
Nitrite: NEGATIVE
Protein, ur: NEGATIVE mg/dL
Specific Gravity, Urine: 1.026 (ref 1.005–1.030)
pH: 5 (ref 5.0–8.0)

## 2019-05-24 LAB — COMPREHENSIVE METABOLIC PANEL
ALT: 18 U/L (ref 0–44)
AST: 18 U/L (ref 15–41)
Albumin: 3.6 g/dL (ref 3.5–5.0)
Alkaline Phosphatase: 88 U/L (ref 38–126)
Anion gap: 10 (ref 5–15)
BUN: 14 mg/dL (ref 6–20)
CO2: 23 mmol/L (ref 22–32)
Calcium: 9.2 mg/dL (ref 8.9–10.3)
Chloride: 100 mmol/L (ref 98–111)
Creatinine, Ser: 0.97 mg/dL (ref 0.61–1.24)
GFR calc Af Amer: >60 mL/min (ref >60)
GFR calc non Af Amer: >60 mL/min (ref >60)
Glucose, Bld: 141 mg/dL — ABNORMAL HIGH (ref 70–99)
Potassium: 3.3 mmol/L — ABNORMAL LOW (ref 3.5–5.1)
Sodium: 133 mmol/L — ABNORMAL LOW (ref 135–145)
Total Bilirubin: 0.6 mg/dL (ref 0.3–1.2)
Total Protein: 7.8 g/dL (ref 6.5–8.1)

## 2019-05-24 LAB — LACTIC ACID, PLASMA
Lactic Acid, Venous: 2.2 mmol/L (ref 0.5–1.9)
Lactic Acid, Venous: 1.7 mmol/L (ref 0.5–1.9)

## 2019-05-24 LAB — RESPIRATORY PANEL BY RT PCR (FLU A&B, COVID)
Influenza A by PCR: NEGATIVE
Influenza B by PCR: NEGATIVE
SARS Coronavirus 2 by RT PCR: NEGATIVE

## 2019-05-24 MED ORDER — ACETAMINOPHEN 325 MG PO TABS
650.0000 mg | ORAL_TABLET | Freq: Once | ORAL | Status: AC
  Administered 2019-05-24: 650 mg via ORAL
  Filled 2019-05-24: qty 2

## 2019-05-24 MED ORDER — POTASSIUM CHLORIDE 20 MEQ PO PACK
20.0000 meq | PACK | Freq: Once | ORAL | Status: DC
  Filled 2019-05-24: qty 1

## 2019-05-24 MED ORDER — SODIUM CHLORIDE 0.9% FLUSH
3.0000 mL | Freq: Once | INTRAVENOUS | Status: AC
  Administered 2019-05-24: 3 mL via INTRAVENOUS

## 2019-05-24 MED ORDER — SODIUM CHLORIDE 0.9 % IV BOLUS: 1000.0000 mL | Freq: Once | INTRAVENOUS | Status: DC

## 2019-05-24 MED ORDER — POTASSIUM CHLORIDE 20 MEQ PO PACK: 40.0000 meq | PACK | Freq: Once | ORAL | Status: DC

## 2019-05-24 NOTE — Discharge Instructions (Addendum)
Currently, you have a fever of unknown origin.  Your blood test shows signs of infection, likely bacterial.  You are at high risk for a infection in your bloodstream.  This is likely fatal if not treated with strong antibiotics.  Please return to the ED when you are ready to receive treatment.       Person Under Monitoring Name: Billy Miles  Location: [address] [place] [address]   Infection Prevention Recommendations for Individuals Confirmed to have, or Being Evaluated for, 2019 Novel Coronavirus (COVID-19) Infection Who Receive Care at Home  Individuals who are confirmed to have, or are being evaluated for, COVID-19 should follow the prevention steps below until a healthcare provider or local or state health department says they can return to normal activities.  Stay home except to get medical care You should restrict activities outside your home, except for getting medical care. Do not go to work, school, or public areas, and do not use public transportation or taxis.  Call ahead before visiting your doctor Before your medical appointment, call the healthcare provider and tell them that you have, or are being evaluated for, COVID-19 infection. This will help the healthcare provider's office take steps to keep other people from getting infected. Ask your healthcare provider to call the local or state health department.  Monitor your symptoms Seek prompt medical attention if your illness is worsening (e.g., difficulty breathing). Before going to your medical appointment, call the healthcare provider and tell them that you have, or are being evaluated for, COVID-19 infection. Ask your healthcare provider to call the local or state health department.  Wear a facemask You should wear a facemask that covers your nose and mouth when you are in the same room with other people and when you visit a healthcare provider. People who live with or visit you should also  wear a facemask while they are in the same room with you.  Separate yourself from other people in your home As much as possible, you should stay in a different room from other people in your home. Also, you should use a separate bathroom, if available.  Avoid sharing household items You should not share dishes, drinking glasses, cups, eating utensils, towels, bedding, or other items with other people in your home. After using these items, you should wash them thoroughly with soap and water.  Cover your coughs and sneezes Cover your mouth and nose with a tissue when you cough or sneeze, or you can cough or sneeze into your sleeve. Throw used tissues in a lined trash can, and immediately wash your hands with soap and water for at least 20 seconds or use an alcohol-based hand rub.  Wash your [employer] your hands often and thoroughly with soap and water for at least 20 seconds. You can use an alcohol-based hand sanitizer if soap and water are not available and if your hands are not visibly dirty. Avoid touching your eyes, nose, and mouth with unwashed hands.   Prevention Steps for Caregivers and Household Members of Individuals Confirmed to have, or Being Evaluated for, COVID-19 Infection Being Cared for in the Home  If you live with, or provide care at home for, a person confirmed to have, or being evaluated for, COVID-19 infection please follow these guidelines to prevent infection:  Follow healthcare provider's instructions Make sure that you understand and can help the patient follow any healthcare provider instructions for all care.  Provide for the patient's basic  needs You should help the patient with basic needs in the home and provide support for getting groceries, prescriptions, and other personal needs.  Monitor the patient's symptoms If they are getting sicker, call his or her medical provider and tell them that the patient has, or is being evaluated for, COVID-19  infection. This will help the healthcare provider's office take steps to keep other people from getting infected. Ask the healthcare provider to call the local or state health department.  Limit the number of people who have contact with the patient If possible, have only one caregiver for the patient. Other household members should stay in another home or place of residence. If this is not possible, they should stay in another room, or be separated from the patient as much as possible. Use a separate bathroom, if available. Restrict visitors who do not have an essential need to be in the home.  Keep older adults, very young children, and other sick people away from the patient Keep older adults, very young children, and those who have compromised immune systems or chronic health conditions away from the patient. This includes people with chronic heart, lung, or kidney conditions, diabetes, and cancer.  Ensure good ventilation Make sure that shared spaces in the home have good air flow, such as from an air conditioner or an opened window, weather permitting.  Wash your hands often Wash your hands often and thoroughly with soap and water for at least 20 seconds. You can use an alcohol based hand sanitizer if soap and water are not available and if your hands are not visibly dirty. Avoid touching your eyes, nose, and mouth with unwashed hands. Use disposable paper towels to dry your hands. If not available, use dedicated cloth towels and replace them when they become wet.  Wear a facemask and gloves Wear a disposable facemask at all times in the room and gloves when you touch or have contact with the patient's blood, body fluids, and/or secretions or excretions, such as sweat, saliva, sputum, nasal mucus, vomit, urine, or feces.  Ensure the mask fits over your nose and mouth tightly, and do not touch it during use. Throw out disposable facemasks and gloves after using them. Do not reuse. Wash  your hands immediately after removing your facemask and gloves. If your personal clothing becomes contaminated, carefully remove clothing and launder. Wash your hands after handling contaminated clothing. Place all used disposable facemasks, gloves, and other waste in a lined container before disposing them with other household waste. Remove gloves and wash your hands immediately after handling these items.  Do not share dishes, glasses, or other household items with the patient Avoid sharing household items. You should not share dishes, drinking glasses, cups, eating utensils, towels, bedding, or other items with a patient who is confirmed to have, or being evaluated for, COVID-19 infection. After the person uses these items, you should wash them thoroughly with soap and water.  Wash laundry thoroughly Immediately remove and wash clothes or bedding that have blood, body fluids, and/or secretions or excretions, such as sweat, saliva, sputum, nasal mucus, vomit, urine, or feces, on them. Wear gloves when handling laundry from the patient. Read and follow directions on labels of laundry or clothing items and detergent. In general, wash and dry with the warmest temperatures recommended on the label.  Clean all areas the individual has used often Clean all touchable surfaces, such as counters, tabletops, doorknobs, bathroom fixtures, toilets, phones, keyboards, tablets, and bedside tables, every day.  Also, clean any surfaces that may have blood, body fluids, and/or secretions or excretions on them. Wear gloves when cleaning surfaces the patient has come in contact with. Use a diluted bleach solution (e.g., dilute bleach with 1 part bleach and 10 parts water) or a household disinfectant with a label that says EPA-registered for coronaviruses. To make a bleach solution at home, add 1 tablespoon of bleach to 1 quart (4 cups) of water. For a larger supply, add  cup of bleach to 1 gallon (16 cups) of  water. Read labels of cleaning products and follow recommendations provided on product labels. Labels contain instructions for safe and effective use of the cleaning product including precautions you should take when applying the product, such as wearing gloves or eye protection and making sure you have good ventilation during use of the product. Remove gloves and wash hands immediately after cleaning.  Monitor yourself for signs and symptoms of illness Caregivers and household members are considered close contacts, should monitor their health, and will be asked to limit movement outside of the home to the extent possible. Follow the monitoring steps for close contacts listed on the symptom monitoring form.   ? If you have additional questions, contact your local health department or call the epidemiologist on call at [phone] (available 24/7). ? This guidance is subject to change. For the most up-to-date guidance from [facility], please refer to their website: [email or web address]
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
## 2019-05-24 NOTE — ED Triage Notes (Addendum)
Pt arrives to ED w/ c/o fatigue, fever. Pt denies cough, SOB, abdominal pain, n/v/d.

## 2019-05-29 LAB — CULTURE, BLOOD (ROUTINE X 2)
Culture: NO GROWTH
Culture: NO GROWTH

## 2019-07-31 ENCOUNTER — Emergency Department (HOSPITAL_COMMUNITY)
Admission: EM | Admit: 2019-07-31 | Discharge: 2019-08-01 | Disposition: A | Discharge status: Home / Self Care | Payer: Self-pay | Attending: Emergency Medicine | Admitting: Emergency Medicine

## 2019-07-31 ENCOUNTER — Emergency Department (HOSPITAL_COMMUNITY): Payer: Self-pay

## 2019-07-31 DIAGNOSIS — F17210 Nicotine dependence, cigarettes, uncomplicated: Secondary | ICD-10-CM | POA: Insufficient documentation

## 2019-07-31 DIAGNOSIS — M25462 Effusion, left knee: Secondary | ICD-10-CM

## 2019-07-31 DIAGNOSIS — F1190 Opioid use, unspecified, uncomplicated: Secondary | ICD-10-CM

## 2019-07-31 DIAGNOSIS — R4182 Altered mental status, unspecified: Secondary | ICD-10-CM | POA: Insufficient documentation

## 2019-07-31 LAB — RAPID URINE DRUG SCREEN, HOSP PERFORMED
Amphetamines: NOT DETECTED
Barbiturates: NOT DETECTED
Benzodiazepines: NOT DETECTED
Cocaine: NOT DETECTED
Opiates: POSITIVE — AB
Tetrahydrocannabinol: NOT DETECTED

## 2019-07-31 LAB — BASIC METABOLIC PANEL
Anion gap: 10 (ref 5–15)
BUN: 12 mg/dL (ref 6–20)
CO2: 25 mmol/L (ref 22–32)
Calcium: 9.1 mg/dL (ref 8.9–10.3)
Chloride: 105 mmol/L (ref 98–111)
Creatinine, Ser: 0.89 mg/dL (ref 0.61–1.24)
GFR calc Af Amer: >60 mL/min (ref >60)
GFR calc non Af Amer: >60 mL/min (ref >60)
Glucose, Bld: 90 mg/dL (ref 70–99)
Potassium: 3.5 mmol/L (ref 3.5–5.1)
Sodium: 140 mmol/L (ref 135–145)

## 2019-07-31 LAB — CBC
HCT: 38 % — ABNORMAL LOW (ref 39.0–52.0)
Hemoglobin: 12.4 g/dL — ABNORMAL LOW (ref 13.0–17.0)
MCH: 27.8 pg (ref 26.0–34.0)
MCHC: 32.6 g/dL (ref 30.0–36.0)
MCV: 85.2 fL (ref 80.0–100.0)
Platelets: 227 10*3/uL (ref 150–400)
RBC: 4.46 MIL/uL (ref 4.22–5.81)
RDW: 15.5 % (ref 11.5–15.5)
WBC: 5 10*3/uL (ref 4.0–10.5)
nRBC: 0 % (ref 0.0–0.2)

## 2019-07-31 LAB — ETHANOL: Alcohol, Ethyl (B): <10 mg/dL (ref <10)
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
## 2019-07-31 NOTE — ED Notes (Signed)
Patient transported to CT 
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
## 2019-08-01 NOTE — Progress Notes (Signed)
Orthopedic Tech Progress Note Patient Details:  Billy Miles 1980/02/10 [id number]  Ortho Devices Type of Ortho Device: Knee Sleeve Ortho Device/Splint Location: lle Ortho Device/Splint Interventions: Ordered, Adjustment, Application   Post Interventions Patient Tolerated: Well Instructions Provided: Care of device, Adjustment of device   Trinna Post 08/01/2019, 12:52 AM

## 2019-08-01 NOTE — ED Notes (Signed)
Pt verbalized understanding of d/c instructions, follow up care and s/s requiring retun to ed. Pt had no further questions and left in custody of [employer]

## 2019-08-01 NOTE — Discharge Instructions (Addendum)
Your xray showed a knee effusion - please follow up with orthopedist Dr. Magnus Ivan regarding this. You can take OTC Ibuprofen and Tylenol as needed for pain The rest of your labwork and imaging were reassuring.  Follow up with [facility] for primary care needs Return to the ED for any worsening symptoms

## 2021-05-03 IMAGING — DX DG KNEE COMPLETE 4+V*R*
4 series · 4 of 4 positions shown · non-contrast
Comparison: Four hundred fifteen

CLINICAL DATA: Pain

EXAM:
RIGHT KNEE - COMPLETE 4+ VIEW; LEFT KNEE - COMPLETE 4+ VIEW

[knee obl (1 of 2)]
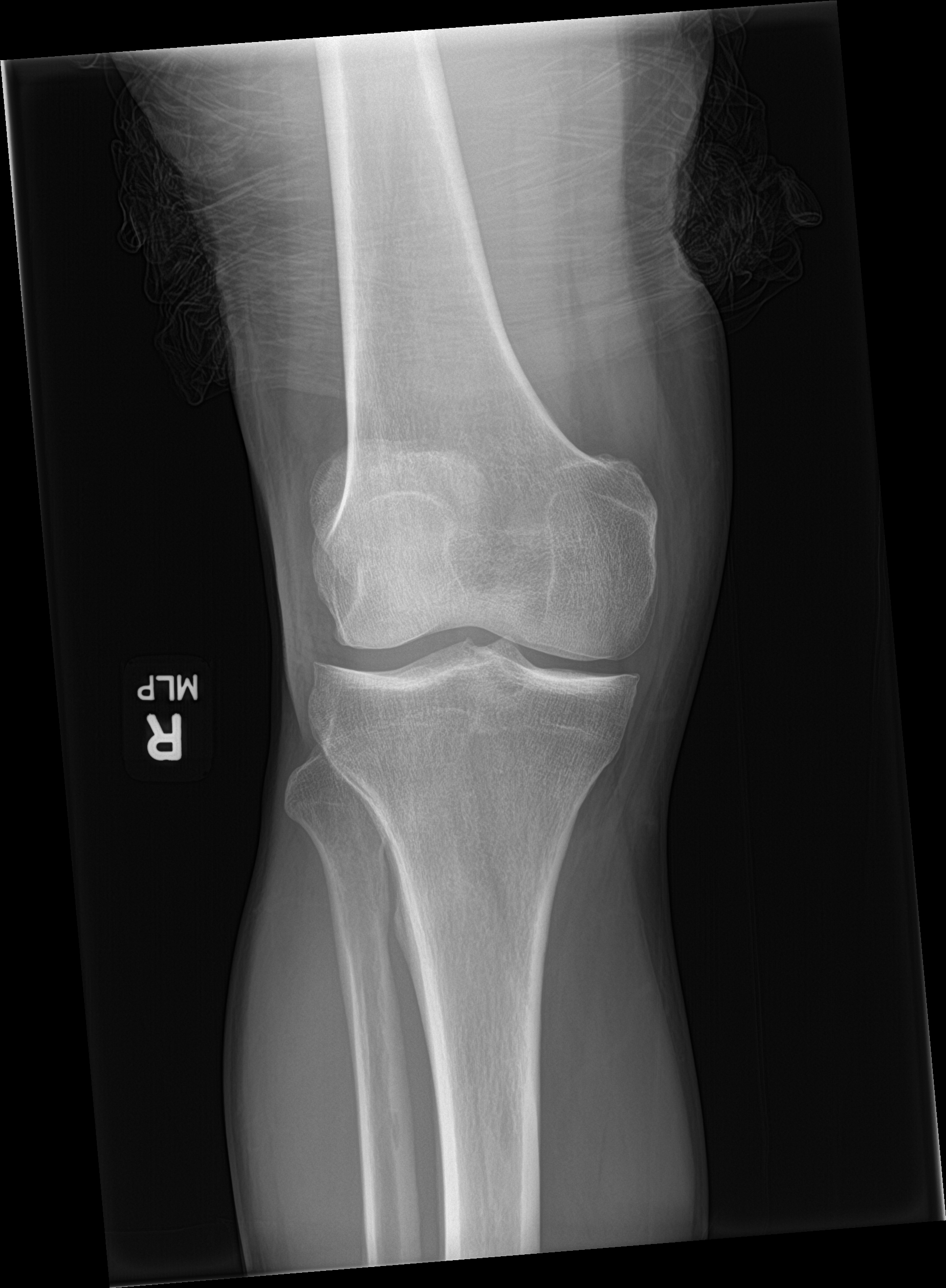

[knee lat]
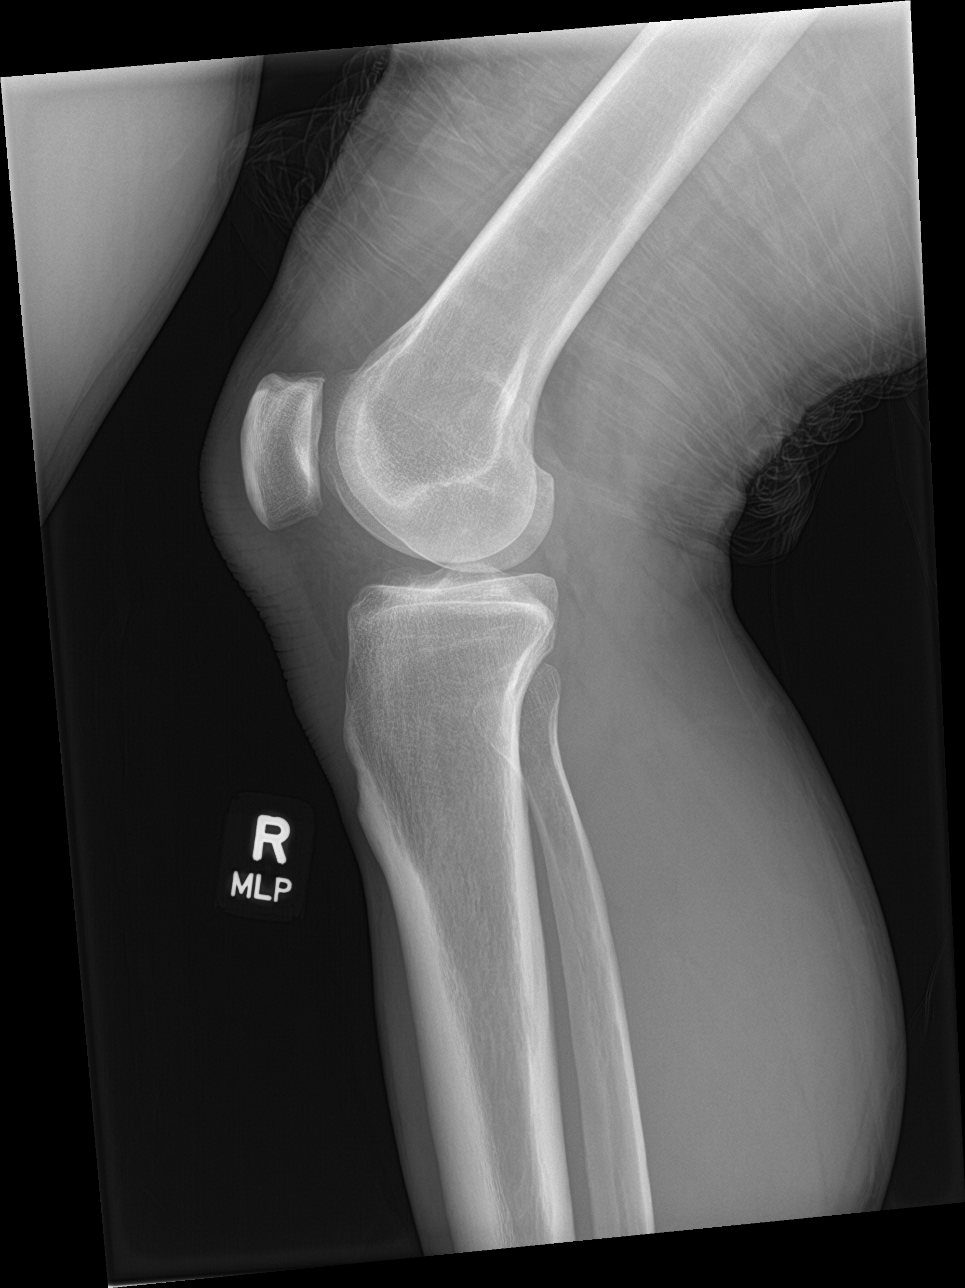

[knee ap]
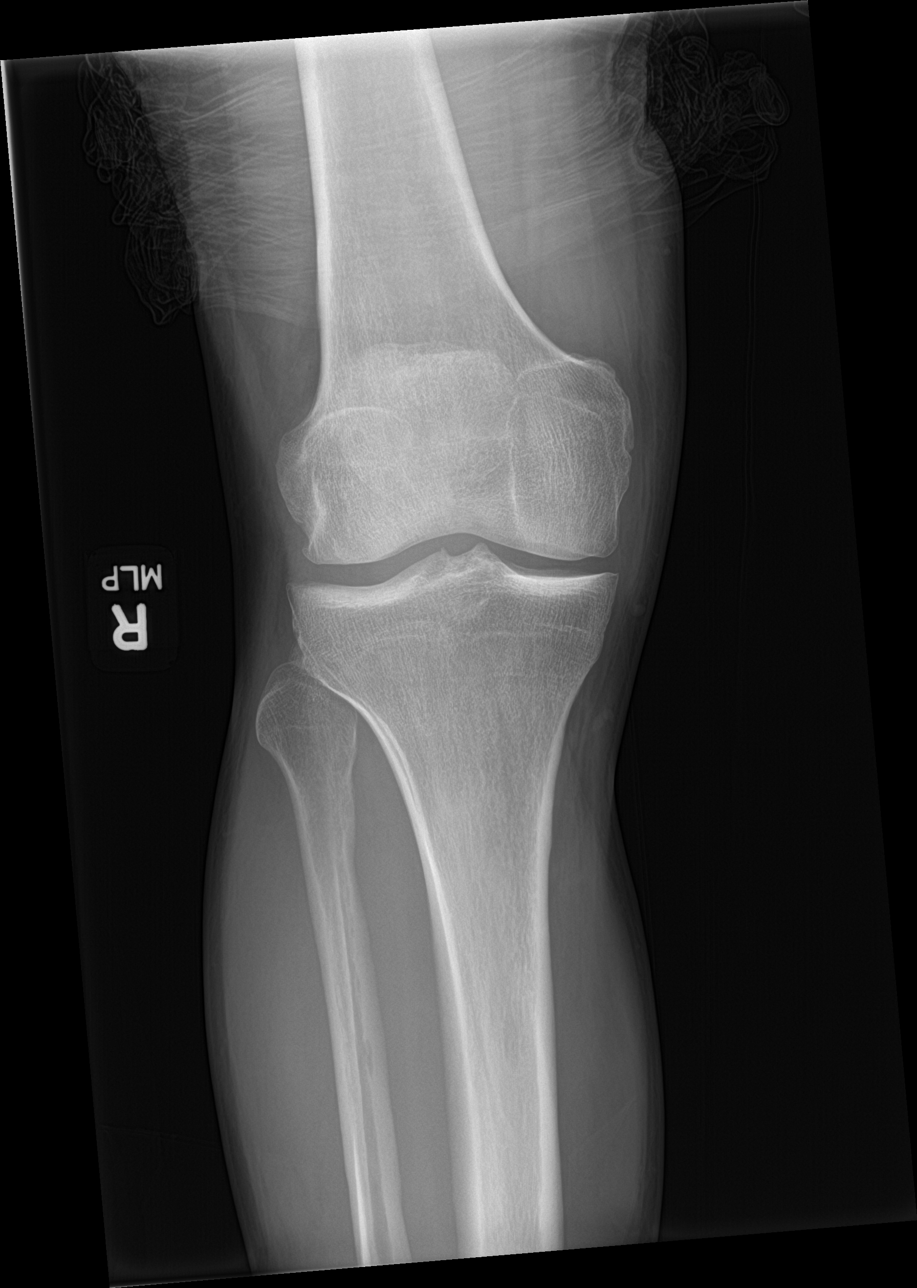

[knee obl (2 of 2)]
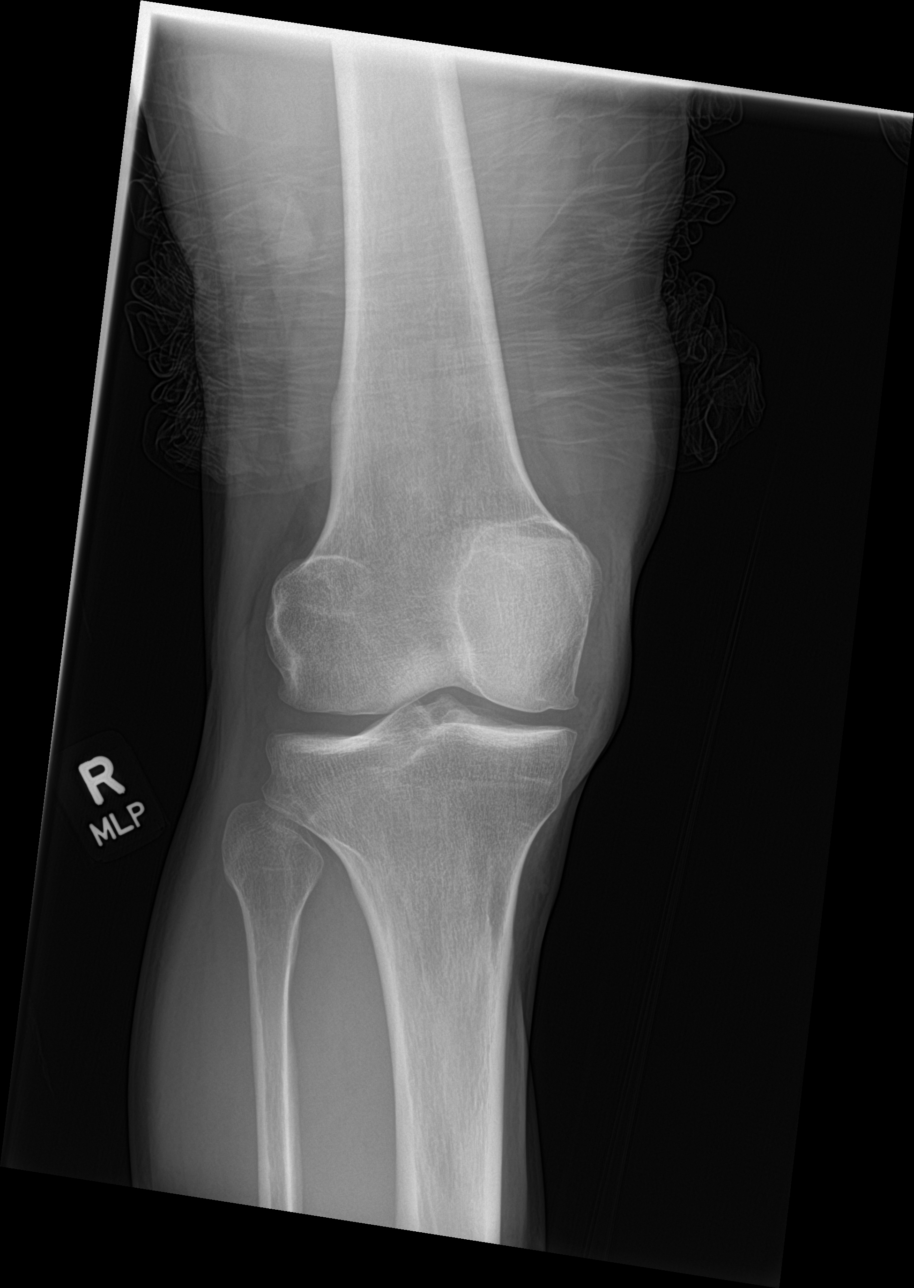

[4 of 4 positions shown; findings below may reference images not displayed]

FINDINGS: There is no acute displaced fracture or dislocation of the right
knee. Mild degenerative changes are noted on the right. There is no
significant joint effusion on the right. The patient is status post
prior ACL repair of the left knee. There are moderate
tricompartmental degenerative changes of the left. There is a trace
suprapatellar joint effusion on the left.
IMPRESSION: 1. No acute displaced fracture or dislocation of the right knee.
2. Mild degenerative changes of the right knee.
3. Moderate tricompartmental degenerative changes of the left knee
with trace suprapatellar joint effusion. No acute displaced fracture
or dislocation on the left.

## 2021-08-28 ENCOUNTER — Emergency Department (HOSPITAL_COMMUNITY): Payer: Self-pay

## 2021-08-28 ENCOUNTER — Emergency Department (HOSPITAL_COMMUNITY)
Admission: EM | Admit: 2021-08-28 | Discharge: 2021-08-29 | Discharge status: Against Medical Advice | Payer: Self-pay | Attending: Emergency Medicine | Admitting: Emergency Medicine

## 2021-08-28 DIAGNOSIS — Z5321 Procedure and treatment not carried out due to patient leaving prior to being seen by health care provider: Secondary | ICD-10-CM | POA: Insufficient documentation

## 2021-08-28 DIAGNOSIS — W268XXA Contact with other sharp object(s), not elsewhere classified, initial encounter: Secondary | ICD-10-CM | POA: Insufficient documentation

## 2021-08-28 DIAGNOSIS — S61412A Laceration without foreign body of left hand, initial encounter: Secondary | ICD-10-CM | POA: Insufficient documentation

## 2021-08-28 DIAGNOSIS — R200 Anesthesia of skin: Secondary | ICD-10-CM | POA: Insufficient documentation
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
## 2021-08-28 NOTE — ED Triage Notes (Signed)
Bleeding controlled, pt states he feels fingers "feeling numb" but sensation intact on exam.
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
## 2021-08-29 ENCOUNTER — Emergency Department (HOSPITAL_COMMUNITY)
Admission: EM | Admit: 2021-08-29 | Discharge: 2021-08-29 | Disposition: A | Discharge status: Home / Self Care | Payer: Self-pay | Attending: Emergency Medicine | Admitting: Emergency Medicine

## 2021-08-29 ENCOUNTER — Other Ambulatory Visit: Payer: Self-pay

## 2021-08-29 DIAGNOSIS — W2209XA Striking against other stationary object, initial encounter: Secondary | ICD-10-CM | POA: Insufficient documentation

## 2021-08-29 DIAGNOSIS — S61419A Laceration without foreign body of unspecified hand, initial encounter: Secondary | ICD-10-CM

## 2021-08-29 DIAGNOSIS — S61412A Laceration without foreign body of left hand, initial encounter: Secondary | ICD-10-CM | POA: Insufficient documentation

## 2021-08-29 MED ORDER — LIDOCAINE HCL 2 % IJ SOLN
10.0000 mL | Freq: Once | INTRAMUSCULAR | Status: AC
  Administered 2021-08-29: 200 mg
  Filled 2021-08-29: qty 20

## 2021-08-29 MED ORDER — CEPHALEXIN 500 MG PO CAPS: 500.0000 mg | ORAL_CAPSULE | Freq: Four times a day (QID) | ORAL | 0 refills | Status: DC

## 2021-08-29 MED ORDER — CEPHALEXIN 250 MG PO CAPS
500.0000 mg | ORAL_CAPSULE | Freq: Once | ORAL | Status: AC
  Administered 2021-08-29: 500 mg via ORAL
  Filled 2021-08-29: qty 2

## 2021-08-29 NOTE — ED Notes (Signed)
X2 no response and not visible in the lobby 

## 2021-08-29 NOTE — ED Notes (Signed)
Patient verbalizes understanding of discharge instructions. Opportunity for questioning and answers were provided. Armband removed by staff, pt discharged from ED and ambulated to lobby to return home.   
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
## 2021-08-29 NOTE — ED Provider Notes (Signed)
[facility] EMERGENCY DEPARTMENT Provider Note   CSN: [id number] Arrival date & time: 08/29/21  0404     History  Chief Complaint  Patient presents with   Hand Injury    Billy Miles is a 41 y.o. male.  41 year old male presents to the emergency department for evaluation of a laceration to his left hand.  Reports striking his hand on the back of the TV causing his injuries.  There is some associated swelling.  No drainage.  He was evaluated in the ED 10 hours ago, but eloped after triage.  He has returned for definitive management of his wounds.  Cannot recall the date of his last tetanus shot.  No associated decreased range of motion, numbness, weakness.  The history is provided by the patient. No language interpreter was used.  Hand Injury      Home Medications Prior to Admission medications   Medication Sig Start Date End Date Taking? Authorizing Provider  cephALEXin (KEFLEX) 500 MG capsule Take 1 capsule (500 mg total) by mouth 4 (four) times daily. 08/29/21  Yes Antony Madura, PA-C      Allergies    Patient has no known allergies.    Review of Systems   Review of Systems Ten systems reviewed and are negative for acute change, except as noted in the HPI.    Physical Exam Updated Vital Signs BP 92/62 (BP Location: Left Arm)   Pulse (!) 57   Temp 97.9 F (36.6 C) (Oral)   Resp 17   SpO2 99%   Physical Exam Vitals and nursing note reviewed.  Constitutional:      General: He is not in acute distress.    Appearance: He is well-developed. He is not diaphoretic.     Comments: Nontoxic appearing.  HENT:     Head: Normocephalic and atraumatic.  Eyes:     General: No scleral icterus.    Conjunctiva/sclera: Conjunctivae normal.  Cardiovascular:     Rate and Rhythm: Normal rate and regular rhythm.     Pulses: Normal pulses.     Comments: Distal radial pulse 2+ in the LUE Pulmonary:     Effort: Pulmonary effort is normal. No respiratory  distress.  Musculoskeletal:        General: Normal range of motion.     Cervical back: Normal range of motion.     Comments: 3cm laceration over the 3rd MCP joint of the L hand. Additional 1cm laceration adjacent to 3cm laceration. No active bleeding. Preserved ROM of the L hand and digits.  Skin:    General: Skin is warm and dry.     Coloration: Skin is not pale.     Findings: No erythema or rash.  Neurological:     Mental Status: He is alert and oriented to person, place, and time.     Coordination: Coordination normal.     Comments: Sensation to light touch intact in all digits of the L hand.  Psychiatric:        Behavior: Behavior normal.     ED Results / Procedures / Treatments   Labs (all labs ordered are listed, but only abnormal results are displayed) Labs Reviewed - No data to display  EKG None  Radiology DG Hand Complete Left  Result Date: 08/28/2021 CLINICAL DATA:  Laceration to knuckle of middle finger. EXAM: LEFT HAND - COMPLETE 3+ VIEW COMPARISON:  None Available. FINDINGS: No acute fracture or dislocation. There may be mild soft tissue swelling dorsally at  the level of the metacarpal phalangeal joints. No radiopaque foreign object. IMPRESSION: No acute osseous abnormality. Electronically Signed   By: Jeronimo Greaves M.D.   On: 08/28/2021 19:12    Procedures .Marland KitchenLaceration Repair  Date/Time: 08/29/2021 6:11 AM  Performed by: Antony Madura, PA-C Authorized by: Antony Madura, PA-C   Consent:    Consent obtained:  Verbal and emergent situation   Consent given by:  Patient   Risks, benefits, and alternatives were discussed: yes     Risks discussed:  Infection, pain, poor cosmetic result, retained foreign body and poor wound healing   Alternatives discussed:  No treatment Universal protocol:    Imaging studies available: yes     Required blood products, implants, devices, and special equipment available: yes     Immediately prior to procedure, a time out was called: yes      Patient identity confirmed:  Verbally with patient and arm band Anesthesia:    Anesthesia method:  Local infiltration   Local anesthetic:  Lidocaine 2% w/o epi Laceration details:    Location:  Hand   Hand location:  L hand, dorsum   Length (cm):  3 Exploration:    Imaging obtained: x-ray     Imaging outcome: foreign body not noted     Wound exploration: entire depth of wound visualized     Contaminated: yes   Treatment:    Area cleansed with:  Povidone-iodine   Amount of cleaning:  Extensive   Irrigation solution:  Tap water   Irrigation volume:  >1000cc   Irrigation method:  Pressure wash   Debridement:  None Skin repair:    Repair method:  Sutures   Suture size:  4-0   Suture material:  Prolene   Suture technique:  Simple interrupted   Number of sutures:  3 Approximation:    Approximation:  Close Repair type:    Repair type:  Intermediate Post-procedure details:    Dressing:  Open (no dressing)   Procedure completion:  Tolerated well, no immediate complications .Marland KitchenLaceration Repair  Date/Time: 08/29/2021 6:15 AM  Performed by: Antony Madura, PA-C Authorized by: Antony Madura, PA-C   Consent:    Consent obtained:  Verbal and emergent situation   Consent given by:  Patient   Risks, benefits, and alternatives were discussed: yes     Risks discussed:  Infection, poor cosmetic result, poor wound healing, retained foreign body and pain   Alternatives discussed:  No treatment Universal protocol:    Imaging studies available: yes     Required blood products, implants, devices, and special equipment available: yes     Immediately prior to procedure, a time out was called: yes     Patient identity confirmed:  Verbally with patient and arm band Anesthesia:    Anesthesia method:  Local infiltration   Local anesthetic:  Lidocaine 2% w/o epi Laceration details:    Location:  Hand   Hand location:  L hand, dorsum   Length (cm):  1 Exploration:    Imaging obtained: x-ray      Imaging outcome: foreign body not noted     Contaminated: yes   Treatment:    Area cleansed with:  Povidone-iodine   Amount of cleaning:  Extensive   Irrigation solution:  Tap water   Irrigation volume:  >1000cc   Irrigation method:  Pressure wash   Debridement:  None Skin repair:    Repair method:  Sutures   Suture size:  4-0   Suture material:  Prolene  Suture technique:  Horizontal mattress   Number of sutures:  1 Approximation:    Approximation:  Close Repair type:    Repair type:  Intermediate Post-procedure details:    Dressing:  Open (no dressing)   Procedure completion:  Tolerated well, no immediate complications     Medications Ordered in ED Medications  lidocaine (XYLOCAINE) 2 % (with pres) injection 200 mg (200 mg Infiltration Given by Other 08/29/21 0616)  cephALEXin (KEFLEX) capsule 500 mg (500 mg Oral Given 08/29/21 2355)    ED Course/ Medical Decision Making/ A&P Clinical Course as of 08/29/21 0622  Wed Aug 29, 2021  0449 Last Tdap in system documented on 06/2017. No indication for repeat tetanus today. [KH]  0530 I personally viewed and interpreted the patient's prior hand Xray which shows no evidence of bony injury or foreign body. [KH]    Clinical Course User Index [KH] Antony Madura, PA-C                           Medical Decision Making Risk Prescription drug management.   Tdap booster UTD. Pressure irrigation performed. Patient has no comorbidities to effect normal wound healing. Will initiate course of PO Keflex for infection prevention given time from injury onset to wound closure, dirty wounds. Discussed suture home care with patient and answered questions. Patient to follow up for wound check and suture removal in 10 days. Patient is hemodynamically stable with no complaints prior to discharge.          Final Clinical Impression(s) / ED Diagnoses Final diagnoses:  Laceration of dorsum of hand    Rx / DC Orders ED Discharge Orders           Ordered    cephALEXin (KEFLEX) 500 MG capsule  4 times daily        08/29/21 0610              Antony Madura, PA-C 08/29/21 0622    Mesner, Barbara Cower, MD 08/29/21 [phone]

## 2021-08-29 NOTE — Discharge Instructions (Signed)
Avoid soaking your wound in stagnant or dirty water such as while taking a bath. You can shower normally. Keep the area clean with mild soap and warm water. Do not apply peroxide or alcohol to your wound as this can break down newly forming skin and prolong wound healing. If you keep the area bandaged, change the dressing/bandage at least once per day. Have your staples/sutures removed in 10 days. 

## 2021-10-10 ENCOUNTER — Emergency Department (HOSPITAL_COMMUNITY)
Admission: EM | Admit: 2021-10-10 | Discharge: 2021-10-11 | Discharge status: Against Medical Advice | Payer: Self-pay | Attending: Emergency Medicine | Admitting: Emergency Medicine

## 2021-10-10 ENCOUNTER — Encounter (HOSPITAL_COMMUNITY): Payer: Self-pay | Admitting: Emergency Medicine

## 2021-10-10 ENCOUNTER — Other Ambulatory Visit: Payer: Self-pay

## 2021-10-10 DIAGNOSIS — R42 Dizziness and giddiness: Secondary | ICD-10-CM | POA: Insufficient documentation

## 2021-10-10 DIAGNOSIS — Z5321 Procedure and treatment not carried out due to patient leaving prior to being seen by health care provider: Secondary | ICD-10-CM | POA: Insufficient documentation

## 2021-10-10 LAB — BASIC METABOLIC PANEL
Anion gap: 9 (ref 5–15)
BUN: 13 mg/dL (ref 6–20)
CO2: 23 mmol/L (ref 22–32)
Calcium: 8.8 mg/dL — ABNORMAL LOW (ref 8.9–10.3)
Chloride: 103 mmol/L (ref 98–111)
Creatinine, Ser: 0.79 mg/dL (ref 0.61–1.24)
GFR, Estimated: >60 mL/min (ref >60)
Glucose, Bld: 143 mg/dL — ABNORMAL HIGH (ref 70–99)
Potassium: 3.4 mmol/L — ABNORMAL LOW (ref 3.5–5.1)
Sodium: 135 mmol/L (ref 135–145)

## 2021-10-10 LAB — CBC
HCT: 38.5 % — ABNORMAL LOW (ref 39.0–52.0)
Hemoglobin: 12.2 g/dL — ABNORMAL LOW (ref 13.0–17.0)
MCH: 28.9 pg (ref 26.0–34.0)
MCHC: 31.7 g/dL (ref 30.0–36.0)
MCV: 91.2 fL (ref 80.0–100.0)
Platelets: 226 10*3/uL (ref 150–400)
RBC: 4.22 MIL/uL (ref 4.22–5.81)
RDW: 13.9 % (ref 11.5–15.5)
WBC: 8 10*3/uL (ref 4.0–10.5)
nRBC: 0 % (ref 0.0–0.2)

## 2021-10-10 LAB — RAPID HIV SCREEN (HIV 1/2 AB+AG)
HIV 1/2 Antibodies: NONREACTIVE
HIV-1 P24 Antigen - HIV24: NONREACTIVE

## 2021-10-10 NOTE — ED Provider Triage Note (Signed)
Emergency Medicine Provider Triage Evaluation Note  Conni Elliot , a 41 y.o. male  was evaluated in triage.  Pt complains of dizzy. Report for the past 2-3 weeks he has bouts of dizziness upon standing.  Also report hx of IVDU.  Denies fever, endorse occasional cp.  Denies headache, cough, arm pain  Review of Systems  Positive: As above Negative: As above  Physical Exam  BP 120/82 (BP Location: Right Arm)   Pulse 68   Temp 98 F (36.7 C)   Resp 18   SpO2 100%  Gen:   Awake, no distress   Resp:  Normal effort  MSK:   Moves extremities without difficulty  Other:    Medical Decision Making  Medically screening exam initiated at 9:06 PM.  Appropriate orders placed.  Conni Elliot was informed that the remainder of the evaluation will be completed by another provider, this initial triage assessment does not replace that evaluation, and the importance of remaining in the ED until their evaluation is complete.     Domenic Moras, PA-C 10/10/21 2110

## 2021-10-10 NOTE — ED Triage Notes (Signed)
Patient reports intermittent dizziness for 2 weeks . Alert and oriented , respirations unlabored . No fever or chills .

## 2021-10-11 LAB — HEPATITIS PANEL, ACUTE
HCV Ab: REACTIVE — AB
Hep A IgM: NONREACTIVE
Hep B C IgM: NONREACTIVE
Hepatitis B Surface Ag: NONREACTIVE

## 2021-10-11 NOTE — ED Notes (Signed)
PT not in lobby during vitals sweep. PT named called X3 no response

## 2022-09-15 ENCOUNTER — Emergency Department (HOSPITAL_BASED_OUTPATIENT_CLINIC_OR_DEPARTMENT_OTHER): Payer: Self-pay

## 2022-09-15 ENCOUNTER — Emergency Department (HOSPITAL_BASED_OUTPATIENT_CLINIC_OR_DEPARTMENT_OTHER)
Admission: EM | Admit: 2022-09-15 | Discharge: 2022-09-15 | Disposition: A | Discharge status: Home / Self Care | Payer: Self-pay | Attending: Emergency Medicine | Admitting: Emergency Medicine

## 2022-09-15 ENCOUNTER — Encounter (HOSPITAL_BASED_OUTPATIENT_CLINIC_OR_DEPARTMENT_OTHER): Payer: Self-pay

## 2022-09-15 DIAGNOSIS — Y9339 Activity, other involving climbing, rappelling and jumping off: Secondary | ICD-10-CM | POA: Insufficient documentation

## 2022-09-15 DIAGNOSIS — X501XXA Overexertion from prolonged static or awkward postures, initial encounter: Secondary | ICD-10-CM | POA: Insufficient documentation

## 2022-09-15 DIAGNOSIS — S82899A Other fracture of unspecified lower leg, initial encounter for closed fracture: Secondary | ICD-10-CM

## 2022-09-15 DIAGNOSIS — S92151A Displaced avulsion fracture (chip fracture) of right talus, initial encounter for closed fracture: Secondary | ICD-10-CM | POA: Insufficient documentation

## 2022-09-15 MED ORDER — IBUPROFEN 800 MG PO TABS
800.0000 mg | ORAL_TABLET | Freq: Once | ORAL | Status: AC
  Administered 2022-09-15: 800 mg via ORAL

## 2022-09-15 NOTE — ED Notes (Signed)
Dc instructions reviewed with patient. Patient voiced understanding. Dc with belongings.  °

## 2022-09-15 NOTE — ED Provider Notes (Signed)
[place] EMERGENCY DEPARTMENT AT [facility] Provider Note   CSN: [id number] Arrival date & time: 09/15/22  1339     History  Chief Complaint  Patient presents with   Ankle Pain    Billy Miles is a 42 y.o. male otherwise healthy presents today for evaluation of right ankle pain.  Patient reports that he was jumping around with his son when he twisted his ankle.  States that he was able to ambulate with significant difficulties.  He states that he is still able to bear some weight on the right foot.  Since yesterday he has noticed increased swelling and pain and bruises on his ankle and foot.  He has not tried any medication at home for pain.   Ankle Pain   Past Medical History:  Diagnosis Date   Back pain    Knee pain    Past Surgical History:  Procedure Laterality Date   KNEE SURGERY       Home Medications Prior to Admission medications   Medication Sig Start Date End Date Taking? Authorizing Provider  cephALEXin (KEFLEX) 500 MG capsule Take 1 capsule (500 mg total) by mouth 4 (four) times daily. 08/29/21   Antony Madura, PA-C      Allergies    Patient has no known allergies.    Review of Systems   Review of Systems Negative except as per HPI.  Physical Exam Updated Vital Signs BP 129/82 (BP Location: Right Arm)   Pulse 77   Temp 98.2 F (36.8 C)   Resp 18   SpO2 98%  Physical Exam Vitals and nursing note reviewed.  Constitutional:      Appearance: Normal appearance.  HENT:     Head: Normocephalic and atraumatic.     Mouth/Throat:     Mouth: Mucous membranes are moist.  Eyes:     General: No scleral icterus. Cardiovascular:     Rate and Rhythm: Normal rate and regular rhythm.     Pulses: Normal pulses.     Heart sounds: Normal heart sounds.  Pulmonary:     Effort: Pulmonary effort is normal.     Breath sounds: Normal breath sounds.  Abdominal:     General: Abdomen is flat.     Palpations: Abdomen is soft.     Tenderness: There is  no abdominal tenderness.  Musculoskeletal:        General: No deformity.  Skin:    General: Skin is warm.     Findings: No rash.  Neurological:     General: No focal deficit present.     Mental Status: He is alert.  Psychiatric:        Mood and Affect: Mood normal.     ED Results / Procedures / Treatments   Labs (all labs ordered are listed, but only abnormal results are displayed) Labs Reviewed - No data to display  EKG None  Radiology No results found.  Procedures Procedures    Medications Ordered in ED Medications - No data to display  ED Course/ Medical Decision Making/ A&P                                 Medical Decision Making Amount and/or Complexity of Data Reviewed Radiology: ordered.  Risk Prescription drug management.   This patient presents to the ED for ankle pain, this involves an extensive number of treatment options, and is a complaint that carries with a  high risk of complications and morbidity.  The differential diagnosis includes fracture, dislocation, ligamentous injury.  This is not an exhaustive list.  Imaging studies: I ordered imaging studies, personally reviewed, interpreted imaging and agree with the radiologist's interpretations. The results include: X-ray of the right ankle show an acute lateral ankle fracture.  Problem list/ ED course/ Critical interventions/ Medical management: HPI: See above Vital signs within normal range and stable throughout visit. Laboratory/imaging studies significant for: See above. On physical examination, patient is afebrile and appears in no acute distress.  There was swelling, bruises, tenderness to palpation to the right foot.  DP PT pulses present. patient is able to move his right toes.  No loss of sensation.  X-ray of the right ankle show an acute lateral ankle fracture.  Ordered cam boot and crutches. Advised patient to take Tylenol/ibuprofen/naproxen for pain, follow-up with primary care physician for  further evaluation and management, return to the ER if new or worsening symptoms. I have reviewed the patient home medicines and have made adjustments as needed.  Cardiac monitoring/EKG: The patient was maintained on a cardiac monitor.  I personally reviewed and interpreted the cardiac monitor which showed an underlying rhythm of: sinus rhythm.  Additional history obtained: External records from outside source obtained and reviewed including: Chart review including previous notes, labs, imaging.  Consultations obtained: I spoke to Dr. Thomasena Edis orthopedics who recommended cam boot, leg elevation and outpatient follow-up.  Disposition Continued outpatient therapy. Follow-up with orhtopedics recommended for reevaluation of symptoms. Treatment plan discussed with patient.  Pt acknowledged understanding was agreeable to the plan. Worrisome signs and symptoms were discussed with patient, and patient acknowledged understanding to return to the ED if they noticed these signs and symptoms. Patient was stable upon discharge.   This chart was dictated using voice recognition software.  Despite best efforts to proofread,  errors can occur which can change the documentation meaning.          Final Clinical Impression(s) / ED Diagnoses Final diagnoses:  Avulsion fracture of ankle, unspecified laterality, closed, initial encounter    Rx / DC Orders ED Discharge Orders     None         Jeanelle Malling, [place] 09/15/22 2338    Pollyann Savoy, MD 09/16/22 [phone]

## 2022-09-15 NOTE — Discharge Instructions (Addendum)
Please keep your foot elevated above the heart level for the next 24-48 hour to reduce swelling. Please take tylenol/ibuprofen for pain. I recommend close follow-up with orthopedics Dr. Odis Hollingshead for reevaluation.  Please do not hesitate to return to emergency department if worrisome signs symptoms we discussed become apparent.

## 2022-09-15 NOTE — ED Triage Notes (Signed)
He states he "twisted" his right ankle yesterday. He c/o pain, swelling and bruising of right ankle area.

## 2022-09-15 NOTE — ED Notes (Signed)
Declined ice pack.

## 2022-09-27 ENCOUNTER — Telehealth: Payer: Self-pay | Admitting: *Deleted

## 2022-09-27 NOTE — Telephone Encounter (Signed)
Transition Care Management Unsuccessful Follow-up Telephone Call  Date of discharge and from where:  [facility]  09/15/2022  Attempts:  1st Attempt  Reason for unsuccessful TCM follow-up call:  Left voice message

## 2022-09-30 ENCOUNTER — Telehealth: Payer: Self-pay | Admitting: *Deleted

## 2022-09-30 NOTE — Telephone Encounter (Signed)
Transition Care Management Unsuccessful Follow-up Telephone Call  Date of discharge and from where:  [facility]  09/15/2022  Attempts:  2nd Attempt  Reason for unsuccessful TCM follow-up call:  Left voice message

## 2022-10-20 ENCOUNTER — Emergency Department (HOSPITAL_COMMUNITY)
Admission: EM | Admit: 2022-10-20 | Discharge: 2022-10-20 | Disposition: A | Discharge status: Home / Self Care | Payer: Medicaid Other | Attending: Emergency Medicine | Admitting: Emergency Medicine

## 2022-10-20 ENCOUNTER — Emergency Department (HOSPITAL_COMMUNITY): Payer: Medicaid Other

## 2022-10-20 DIAGNOSIS — R55 Syncope and collapse: Secondary | ICD-10-CM | POA: Diagnosis not present

## 2022-10-20 DIAGNOSIS — R4182 Altered mental status, unspecified: Secondary | ICD-10-CM | POA: Insufficient documentation

## 2022-10-20 DIAGNOSIS — F419 Anxiety disorder, unspecified: Secondary | ICD-10-CM | POA: Insufficient documentation

## 2022-10-20 DIAGNOSIS — X58XXXA Exposure to other specified factors, initial encounter: Secondary | ICD-10-CM | POA: Insufficient documentation

## 2022-10-20 DIAGNOSIS — R451 Restlessness and agitation: Secondary | ICD-10-CM | POA: Insufficient documentation

## 2022-10-20 DIAGNOSIS — F69 Unspecified disorder of adult personality and behavior: Secondary | ICD-10-CM | POA: Diagnosis not present

## 2022-10-20 DIAGNOSIS — M25562 Pain in left knee: Secondary | ICD-10-CM

## 2022-10-20 DIAGNOSIS — R509 Fever, unspecified: Secondary | ICD-10-CM | POA: Diagnosis not present

## 2022-10-20 DIAGNOSIS — S82891A Other fracture of right lower leg, initial encounter for closed fracture: Secondary | ICD-10-CM

## 2022-10-20 DIAGNOSIS — S99911A Unspecified injury of right ankle, initial encounter: Secondary | ICD-10-CM | POA: Diagnosis present

## 2022-10-20 DIAGNOSIS — R61 Generalized hyperhidrosis: Secondary | ICD-10-CM | POA: Diagnosis not present

## 2022-10-20 MED ORDER — OXYCODONE-ACETAMINOPHEN 5-325 MG PO TABS
1.0000 | ORAL_TABLET | Freq: Once | ORAL | Status: AC
  Administered 2022-10-20: 1 via ORAL
  Filled 2022-10-20: qty 1

## 2022-10-20 NOTE — ED Provider Notes (Signed)
[place] EMERGENCY DEPARTMENT AT [facility] Provider Note   CSN: [id number] Arrival date & time: 10/20/22  1227     History  Chief Complaint  Patient presents with   Altered Mental Status    Billy Miles is a 42 y.o. male.  HPI    42 year old male comes in with chief complaint of agitation and erratic behavior.  According to EMS, patient was found in the middle of the street by [facility].  Patient endorsed SI to them.    Over here patient initially was agitated.  By the time I saw him he was calm.  He is indicating that he has been having leg pain.  He was playing with grandkids and injured his knee.  He was laying down because he was in pain.  He is not forthcoming when I asked him why police got involved.  He tells me he is currently homeless.  Patient denies any drug use, but there is previous history of illicit drug use.  Patient tells me that he is having pain over his left knee and right ankle.  He was seen in the ER few days ago and he was found to have ankle fracture.  He wants something for pain.  Upon repeat questioning at the end of the encounter, patient confirms again that he has no SI, HI.  Home Medications Prior to Admission medications   Medication Sig Start Date End Date Taking? Authorizing Provider  cephALEXin (KEFLEX) 500 MG capsule Take 1 capsule (500 mg total) by mouth 4 (four) times daily. 08/29/21   Antony Madura, PA-C      Allergies    Patient has no known allergies.    Review of Systems   Review of Systems  Physical Exam Updated Vital Signs BP 113/66 (BP Location: Left Arm)   Pulse 82   Temp 97.9 F (36.6 C) (Oral)   Resp 19   Ht 5\' 4"  (1.626 m)   Wt 72.6 kg   SpO2 97%   BMI 27.47 kg/m  Physical Exam Vitals and nursing note reviewed.  Constitutional:      Appearance: He is well-developed.  HENT:     Head: Atraumatic.  Cardiovascular:     Rate and Rhythm: Normal rate.  Pulmonary:     Effort: Pulmonary effort is normal.   Musculoskeletal:     Cervical back: Neck supple.     Comments: Mild swelling of the left knee.  Patient able to flex and extend the knee.  Patient has mild swelling of the right ankle with tenderness to palpation.  Skin:    General: Skin is warm.  Neurological:     Mental Status: He is alert and oriented to person, place, and time.  Psychiatric:        Attention and Perception: Attention normal.        Mood and Affect: Mood is anxious.        Behavior: Behavior is hyperactive.        Thought Content: Thought content does not include homicidal or suicidal ideation.        Judgment: Judgment is impulsive.     ED Results / Procedures / Treatments   Labs (all labs ordered are listed, but only abnormal results are displayed) Labs Reviewed - No data to display  EKG None  Radiology DG Ankle Complete Right  Result Date: 10/20/2022 CLINICAL DATA:  Fall/twisting injury with ankle pain. EXAM: RIGHT ANKLE - COMPLETE 3+ VIEW COMPARISON:  Ankle radiographs dated 09/15/2022.  FINDINGS: The previously described avulsion fracture of the lateral ankle is redemonstrated. There is no evidence of new acute fracture, dislocation, or joint effusion. A plantar calcaneal enthesophyte and mild degenerative changes of the dorsal midfoot are noted. Soft tissues are unremarkable. IMPRESSION: No acute osseous injury. Unchanged avulsion fracture of the lateral ankle. Electronically Signed   By: Romona Curls M.D.   On: 10/20/2022 14:10   DG Knee Complete 4 Views Left  Result Date: 10/20/2022 CLINICAL DATA:  Fall, twisting injury EXAM: LEFT KNEE - COMPLETE 4+ VIEW COMPARISON:  Knee radiographs dated 07/31/2019. FINDINGS: The patient is status post anterior cruciate ligament repair. No evidence of fracture, dislocation, or joint effusion. 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 Soft tissues are unremarkable. IMPRESSION: No acute osseous injury. Electronically Signed   By: Romona Curls M.D.   On:  10/20/2022 14:07    Procedures Procedures    Medications Ordered in ED Medications  oxyCODONE-acetaminophen (PERCOCET/ROXICET) 5-325 MG per tablet 1 tablet (1 tablet Oral Given 10/20/22 1316)    ED Course/ Medical Decision Making/ A&P                                 Medical Decision Making Amount and/or Complexity of Data Reviewed Radiology: ordered.  Risk Prescription drug management.   42 year old male comes in with chief complaint of knee pain. However, it appears that to EMS he indicated SI.  Police were involved in his case as he was found laying by the street.  He threatened to hurt other people at some point as well.  By the time I saw him, he is much calmer.  He denies any SI, HI.  He is appearing emotionally labile, tearful and stating that he is having severe knee pain.  He apologizes for acting like a "girl", and states that his ankle was also heard few days back.   Patient denies any substance use issues. He states that he is homeless.  From the perspective of knee pain, differential includes fracture of the knee, soft tissue injury of the knee, meniscal injury.  X-ray of the knee ordered.  I have reviewed patient's records, it appears that he was seen few days ago and was found to have avulsion fracture of his right ankle.  Patient currently not having a boot on.  We will get repeat x-ray of the ankle as he is also complaining of ankle pain.  He is ambulating and bearing weight on both of his legs.  Additionally, especially since patient mention SI, I reassessed him on 2 separate occasions after my initial assessment.  Patient continues to deny SI.  He then tells me that he is homeless.  I asked him if he can contact family members for him and he declined.  I reviewed patient's records.  On his emergency contact is listed his mother and sister.  I called patient's mother, but there was no response.  I attempted to call the sister and she did pick up.  She indicated  that she has not seen the patient in over 4 months.  She informs me that she he was living with a girl.  She did not want to answer directly whether patient had any mental illness or substance use disorder.  She cannot come and pick him up as she has to go to church.   Patient assessed at 4:45 PM again.  Informed him that we will discharging him.  We have  given him orthopedic follow-up information. He is comfortable with the plan.  Final Clinical Impression(s) / ED Diagnoses Final diagnoses:  Acute pain of left knee  Closed fracture of right ankle, initial encounter    Rx / DC Orders ED Discharge Orders     None         Derwood Kaplan, MD 10/20/22 1701

## 2022-10-20 NOTE — ED Triage Notes (Addendum)
Pt BIB EMS after being found unconscious in the middle of the road by sheriff. Pt is A&Ox4, erratic and endorsed SI with EMS. Pt stated "he will kill everyone" if he gets sedated again with EMS. Pt denies SI/HI at this time and is reporting left knee pain and cramping in left calf. He denies taking illicit drugs and reports erratic behavior is due to a recent death someone he cares for.

## 2022-10-20 NOTE — ED Notes (Signed)
Knee immobilizer applied

## 2022-10-20 NOTE — ED Notes (Signed)
Pt refused CAM boot

## 2022-10-20 NOTE — ED Notes (Signed)
PT sleeping in gurney.

## 2022-10-20 NOTE — ED Notes (Signed)
Pt sitting on chair

## 2022-10-20 NOTE — ED Notes (Addendum)
Pt yelling inside the room, walking around not reporting any leg pain upon ambulation. Respirations are equal bilaterally and unlabored at this time.

## 2022-10-20 NOTE — ED Notes (Signed)
Pt standing at door window.

## 2022-10-20 NOTE — Discharge Instructions (Addendum)
Please follow-up with the orthopedic doctor to ensure that you are healing well. Take Tylenol or Motrin for symptom management.

## 2022-10-20 NOTE — ED Notes (Signed)
This RN reviewed discharge instructions with patient. He verbalized understanding and denied any further questions. PT well appearing upon discharge and reports tolerable pain. Pt ambulated with stable gait to exit. Pt endorses ride home.  

## 2022-10-20 NOTE — Progress Notes (Signed)
Orthopedic Tech Progress Note Patient Details:  Billy Miles [birth year] [id number]  Ortho Devices Type of Ortho Device: Knee Immobilizer Ortho Device/Splint Location: LLE Ortho Device/Splint Interventions: Ordered, Application, Adjustment   Post Interventions Patient Tolerated: Fair Instructions Provided: Care of device, Adjustment of device  Billy Miles Carmine Savoy 10/20/2022, 4:27 PM

## 2022-10-20 NOTE — ED Notes (Signed)
Provider at bedside

## 2022-10-20 NOTE — ED Notes (Signed)
Xray at bedside, pt not tolerating well. A significant amount of redirection was necessary.

## 2023-05-07 ENCOUNTER — Emergency Department (HOSPITAL_COMMUNITY)
Admission: EM | Admit: 2023-05-07 | Discharge: 2023-05-07 | Disposition: A | Discharge status: Home / Self Care | Attending: Emergency Medicine | Admitting: Emergency Medicine

## 2023-05-07 ENCOUNTER — Encounter (HOSPITAL_COMMUNITY): Payer: Self-pay

## 2023-05-07 ENCOUNTER — Other Ambulatory Visit: Payer: Self-pay

## 2023-05-07 DIAGNOSIS — R462 Strange and inexplicable behavior: Secondary | ICD-10-CM | POA: Insufficient documentation

## 2023-05-07 DIAGNOSIS — Z008 Encounter for other general examination: Secondary | ICD-10-CM

## 2023-05-07 LAB — CBC WITH DIFFERENTIAL/PLATELET
Abs Immature Granulocytes: 0.03 10*3/uL (ref 0.00–0.07)
Basophils Absolute: 0 10*3/uL (ref 0.0–0.1)
Basophils Relative: 0 %
Eosinophils Absolute: 0.2 10*3/uL (ref 0.0–0.5)
Eosinophils Relative: 2 %
HCT: 41.9 % (ref 39.0–52.0)
Hemoglobin: 13.4 g/dL (ref 13.0–17.0)
Immature Granulocytes: 0 %
Lymphocytes Relative: 19 %
Lymphs Abs: 1.9 10*3/uL (ref 0.7–4.0)
MCH: 28.6 pg (ref 26.0–34.0)
MCHC: 32 g/dL (ref 30.0–36.0)
MCV: 89.5 fL (ref 80.0–100.0)
Monocytes Absolute: 0.8 10*3/uL (ref 0.1–1.0)
Monocytes Relative: 8 %
Neutro Abs: 6.9 10*3/uL (ref 1.7–7.7)
Neutrophils Relative %: 71 %
Platelets: 201 10*3/uL (ref 150–400)
RBC: 4.68 MIL/uL (ref 4.22–5.81)
RDW: 14.4 % (ref 11.5–15.5)
WBC: 9.7 10*3/uL (ref 4.0–10.5)
nRBC: 0 % (ref 0.0–0.2)

## 2023-05-07 LAB — BASIC METABOLIC PANEL WITH GFR
Anion gap: 8 (ref 5–15)
BUN: 27 mg/dL — ABNORMAL HIGH (ref 6–20)
CO2: 25 mmol/L (ref 22–32)
Calcium: 8.9 mg/dL (ref 8.9–10.3)
Chloride: 103 mmol/L (ref 98–111)
Creatinine, Ser: 1.19 mg/dL (ref 0.61–1.24)
GFR, Estimated: >60 mL/min (ref >60)
Glucose, Bld: 114 mg/dL — ABNORMAL HIGH (ref 70–99)
Potassium: 3.9 mmol/L (ref 3.5–5.1)
Sodium: 136 mmol/L (ref 135–145)
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
## 2023-05-07 NOTE — Discharge Instructions (Signed)
 Today you are seen for medical clearance for incarceration.  Thank you for letting us  treat you today. After reviewing your labs, I feel you are safe to go home. Please follow up with your PCP in the next several days and provide them with your records from this visit. Return to the Emergency Room if pain becomes severe or symptoms worsen.
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
## 2023-05-25 ENCOUNTER — Other Ambulatory Visit: Payer: Self-pay

## 2023-05-25 ENCOUNTER — Encounter (HOSPITAL_COMMUNITY): Payer: Self-pay

## 2023-05-25 ENCOUNTER — Emergency Department (HOSPITAL_COMMUNITY)
Admission: EM | Admit: 2023-05-25 | Discharge: 2023-05-25 | Disposition: A | Discharge status: Home / Self Care | Attending: Emergency Medicine | Admitting: Emergency Medicine

## 2023-05-25 DIAGNOSIS — L03113 Cellulitis of right upper limb: Secondary | ICD-10-CM | POA: Diagnosis present

## 2023-05-25 LAB — CBC WITH DIFFERENTIAL/PLATELET
Abs Immature Granulocytes: 0.03 10*3/uL (ref 0.00–0.07)
Basophils Absolute: 0.1 10*3/uL (ref 0.0–0.1)
Basophils Relative: 1 %
Eosinophils Absolute: 0.1 10*3/uL (ref 0.0–0.5)
Eosinophils Relative: 1 %
HCT: 41.2 % (ref 39.0–52.0)
Hemoglobin: 13.4 g/dL (ref 13.0–17.0)
Immature Granulocytes: 0 %
Lymphocytes Relative: 17 %
Lymphs Abs: 1.7 10*3/uL (ref 0.7–4.0)
MCH: 29.1 pg (ref 26.0–34.0)
MCHC: 32.5 g/dL (ref 30.0–36.0)
MCV: 89.4 fL (ref 80.0–100.0)
Monocytes Absolute: 0.5 10*3/uL (ref 0.1–1.0)
Monocytes Relative: 5 %
Neutro Abs: 7.2 10*3/uL (ref 1.7–7.7)
Neutrophils Relative %: 76 %
Platelets: 214 10*3/uL (ref 150–400)
RBC: 4.61 MIL/uL (ref 4.22–5.81)
RDW: 14.5 % (ref 11.5–15.5)
WBC: 9.5 10*3/uL (ref 4.0–10.5)
nRBC: 0 % (ref 0.0–0.2)

## 2023-05-25 LAB — COMPREHENSIVE METABOLIC PANEL WITH GFR
ALT: 36 U/L (ref 0–44)
AST: 37 U/L (ref 15–41)
Albumin: 3.7 g/dL (ref 3.5–5.0)
Alkaline Phosphatase: 91 U/L (ref 38–126)
Anion gap: 12 (ref 5–15)
BUN: 9 mg/dL (ref 6–20)
CO2: 25 mmol/L (ref 22–32)
Calcium: 9.2 mg/dL (ref 8.9–10.3)
Chloride: 99 mmol/L (ref 98–111)
Creatinine, Ser: 0.82 mg/dL (ref 0.61–1.24)
GFR, Estimated: >60 mL/min (ref >60)
Glucose, Bld: 102 mg/dL — ABNORMAL HIGH (ref 70–99)
Potassium: 4.3 mmol/L (ref 3.5–5.1)
Sodium: 136 mmol/L (ref 135–145)
Total Bilirubin: 0.7 mg/dL (ref 0.0–1.2)
Total Protein: 7.4 g/dL (ref 6.5–8.1)

## 2023-05-25 MED ORDER — SULFAMETHOXAZOLE-TRIMETHOPRIM 800-160 MG PO TABS: 1.0000 | ORAL_TABLET | Freq: Two times a day (BID) | ORAL | 0 refills | Status: AC

## 2023-05-25 MED ORDER — CEPHALEXIN 500 MG PO CAPS: 500.0000 mg | ORAL_CAPSULE | Freq: Four times a day (QID) | ORAL | 0 refills | Status: AC

## 2023-05-25 NOTE — Discharge Instructions (Signed)
 You were seen in the ER today for concerns of an area of infected skin. Your labs were normal and your ultrasound did not show signs of abscess beneath the skin that could be drained. I have started you on two antibiotics to help clear up this infection. For any concerns for worsening symptoms, return to the ER.

## 2023-05-25 NOTE — ED Provider Notes (Signed)
 [place] EMERGENCY DEPARTMENT AT [facility] Provider Note   CSN: [id number] Arrival date & time: 05/25/23  1655     History Chief Complaint  Patient presents with   Wound Infection    Billy Miles is a 43 y.o. male.  Patient presents to the emergency department today for concerns of a wound infection.  He reports that he sustained a "insect bite" about 2 days ago and has seen the area swell and become red.  He does have a past history of IV drug use but denies any IV drug use recently.  Patient however does appear to be somewhat intoxicated at this time.  Denies systemic symptoms of infection such as fever, fatigue, chills or bodyaches.  He does report the area has been warm to the touch but has not been on any treatment for this.  HPI     Home Medications Prior to Admission medications   Medication Sig Start Date End Date Taking? Authorizing Provider  cephALEXin  (KEFLEX ) 500 MG capsule Take 1 capsule (500 mg total) by mouth 4 (four) times daily for 5 days. 05/25/23 05/30/23 Yes Latravis Grine A, PA-C  sulfamethoxazole-trimethoprim (BACTRIM DS) 800-160 MG tablet Take 1 tablet by mouth 2 (two) times daily for 5 days. 05/25/23 05/30/23 Yes Kleber Crean A, PA-C      Allergies    Patient has no known allergies.    Review of Systems   Review of Systems  Skin:  Positive for wound.  All other systems reviewed and are negative.   Physical Exam Updated Vital Signs BP 120/87   Pulse 94   Temp 98.6 F (37 C)   Resp 14   Ht 5\' 4"  (1.626 m)   Wt 68 kg   SpO2 97%   BMI 25.75 kg/m  Physical Exam Vitals and nursing note reviewed.  Constitutional:      General: He is not in acute distress.    Appearance: He is well-developed.  HENT:     Head: Normocephalic and atraumatic.  Eyes:     Conjunctiva/sclera: Conjunctivae normal.  Cardiovascular:     Rate and Rhythm: Normal rate and regular rhythm.     Heart sounds: No murmur heard. Abdominal:     Palpations: Abdomen is  soft.     Tenderness: There is no abdominal tenderness.  Musculoskeletal:     Cervical back: Neck supple.  Skin:    General: Skin is warm and dry.     Capillary Refill: Capillary refill takes less than 2 seconds.     Findings: Erythema and lesion present.     Comments: Small area of erythema and induration overlying the right wrist along the radial aspect. No obvious area of fluctuance and no drainage present.  Neurological:     Mental Status: He is alert.  Psychiatric:        Mood and Affect: Mood normal.     ED Results / Procedures / Treatments   Labs (all labs ordered are listed, but only abnormal results are displayed) Labs Reviewed  COMPREHENSIVE METABOLIC PANEL WITH GFR - Abnormal; Notable for the following components:      Result Value   Glucose, Bld 102 (*)    All other components within normal limits  CBC WITH DIFFERENTIAL/PLATELET    EKG None  Radiology No results found.  Procedures .Ultrasound ED Soft Tissue  Date/Time: 05/25/2023 6:52 PM  Performed by: Ilhan Madan A, PA-C Authorized by: Joshuah Minella A, PA-C   Procedure details:  Indications: evaluate for cellulitis     Transverse view:  Visualized   Longitudinal view:  Visualized   Images: not archived   Location:    Location: upper extremity     Side:  Right Findings:     no abscess present    cellulitis present    no foreign body present     Medications Ordered in ED Medications - No data to display  ED Course/ Medical Decision Making/ A&P                                 Medical Decision Making Amount and/or Complexity of Data Reviewed Labs: ordered.   This patient presents to the ED for concern of wound infection.  Differential diagnosis includes cellulitis, abscess, osteomyelitis, tickborne illness, sepsis   Lab Tests:  I Ordered, and personally interpreted labs.  The pertinent results include: CBC is unremarkable, CMP with mild hyperglycemia but otherwise  unremarkable   Problem List / ED Course:  Patient presents to the emergency department today with concerns of an infection.  Reports that has been noticing on his lower right wrist in the area of redness and induration for the last several days.  Prior history of IV drug use but denies any IV drug use last several months.  I do question this however, as patient does appear somewhat intoxicated.  He does not know if he has been insect bite to the area that would explain his current symptoms.  Denies any difficulty with movement of the wrist or hand.  No other area of similar symptoms. Physical exam does reveal an area of indurated and erythematous skin along the radial aspect of the right wrist.  No obvious fluctuance.  Will perform bedside ultrasound to verify if there is any fluid collection in the that would be amenable to incision and drainage. Bedside ultrasound reveals no obvious fluid collection.  This appears to be cellulitis.  With prior history of IV drug use and patient appearing to be somewhat intoxicated at this time, I do worry for possible IV drug use associated cellulitis.  Will require coverage for MRSA.  Will start patient on combination of Keflex  and Bactrim.  Advised patient return to the emergency department for any concerns of new or worsening symptoms.  Final Clinical Impression(s) / ED Diagnoses Final diagnoses:  Cellulitis of right upper extremity    Rx / DC Orders ED Discharge Orders          Ordered    cephALEXin  (KEFLEX ) 500 MG capsule  4 times daily        05/25/23 1850    sulfamethoxazole-trimethoprim (BACTRIM DS) 800-160 MG tablet  2 times daily        05/25/23 1850              Ayushi Pla A, PA-C 05/25/23 1853    Tonya Fredrickson, MD 05/26/23 1036

## 2023-05-25 NOTE — ED Triage Notes (Signed)
 Pt c.o "insect bite" to right wrist x 2 days, Area is scabbed over, swollen and red. Hx of IV drug use

## 2023-10-21 ENCOUNTER — Other Ambulatory Visit: Payer: Self-pay

## 2023-10-21 ENCOUNTER — Encounter (HOSPITAL_COMMUNITY): Payer: Self-pay | Admitting: Emergency Medicine

## 2023-10-21 ENCOUNTER — Emergency Department (HOSPITAL_COMMUNITY)
Admission: EM | Admit: 2023-10-21 | Discharge: 2023-10-22 | Disposition: A | Discharge status: Home / Self Care | Attending: Emergency Medicine | Admitting: Emergency Medicine

## 2023-10-21 DIAGNOSIS — F1191 Opioid use, unspecified, in remission: Secondary | ICD-10-CM

## 2023-10-21 DIAGNOSIS — F29 Unspecified psychosis not due to a substance or known physiological condition: Secondary | ICD-10-CM | POA: Insufficient documentation

## 2023-10-21 DIAGNOSIS — Z8659 Personal history of other mental and behavioral disorders: Secondary | ICD-10-CM | POA: Insufficient documentation

## 2023-10-21 DIAGNOSIS — F32A Depression, unspecified: Secondary | ICD-10-CM | POA: Diagnosis present

## 2023-10-21 DIAGNOSIS — F4325 Adjustment disorder with mixed disturbance of emotions and conduct: Secondary | ICD-10-CM | POA: Diagnosis not present

## 2023-10-21 DIAGNOSIS — T50901A Poisoning by unspecified drugs, medicaments and biological substances, accidental (unintentional), initial encounter: Secondary | ICD-10-CM

## 2023-10-21 LAB — CBC WITH DIFFERENTIAL/PLATELET
Abs Immature Granulocytes: 0.05 K/uL (ref 0.00–0.07)
Basophils Absolute: 0 K/uL (ref 0.0–0.1)
Basophils Relative: 1 %
Eosinophils Absolute: 0.1 K/uL (ref 0.0–0.5)
Eosinophils Relative: 1 %
HCT: 41 % (ref 39.0–52.0)
Hemoglobin: 13.4 g/dL (ref 13.0–17.0)
Immature Granulocytes: 1 %
Lymphocytes Relative: 22 %
Lymphs Abs: 1.9 K/uL (ref 0.7–4.0)
MCH: 30 pg (ref 26.0–34.0)
MCHC: 32.7 g/dL (ref 30.0–36.0)
MCV: 91.7 fL (ref 80.0–100.0)
Monocytes Absolute: 0.6 K/uL (ref 0.1–1.0)
Monocytes Relative: 8 %
Neutro Abs: 5.8 K/uL (ref 1.7–7.7)
Neutrophils Relative %: 67 %
Platelets: 171 K/uL (ref 150–400)
RBC: 4.47 MIL/uL (ref 4.22–5.81)
RDW: 15 % (ref 11.5–15.5)
WBC: 8.5 K/uL (ref 4.0–10.5)
nRBC: 0 % (ref 0.0–0.2)

## 2023-10-21 LAB — BASIC METABOLIC PANEL WITH GFR
Anion gap: 13 (ref 5–15)
BUN: 15 mg/dL (ref 6–20)
CO2: 25 mmol/L (ref 22–32)
Calcium: 9.5 mg/dL (ref 8.9–10.3)
Chloride: 101 mmol/L (ref 98–111)
Creatinine, Ser: 1.03 mg/dL (ref 0.61–1.24)
GFR, Estimated: >60 mL/min (ref >60)
Glucose, Bld: 124 mg/dL — ABNORMAL HIGH (ref 70–99)
Potassium: 3.7 mmol/L (ref 3.5–5.1)
Sodium: 139 mmol/L (ref 135–145)

## 2023-10-21 MED ORDER — CLOTRIMAZOLE 1 % EX CREA
TOPICAL_CREAM | Freq: Two times a day (BID) | CUTANEOUS | Status: DC
  Filled 2023-10-21: qty 15

## 2023-10-21 MED ORDER — GRISEOFULVIN MICROSIZE 500 MG PO TABS: 500.0000 mg | ORAL_TABLET | Freq: Every day | ORAL | Status: DC

## 2023-10-21 MED ORDER — CLOTRIMAZOLE 1 % EX CREA: TOPICAL_CREAM | CUTANEOUS | 0 refills | Status: DC

## 2023-10-21 NOTE — ED Notes (Signed)
 Patient requests to wait until he speaks with the doctor before agreeing to placing an IV or getting labs.

## 2023-10-21 NOTE — ED Notes (Addendum)
 It is believed the Narcan  was given at about 1830. Patient reports being stressed by family dynamics.

## 2023-10-21 NOTE — ED Triage Notes (Addendum)
 [name] is from jail. He presents due to probable overdose. It is believed he was incoherent, nodding off and unresponsive. They gave 4mg  narcan . Post narcan , he became fully awake and agitated. Patient denies drug use. He has a history of drug use. Patient complains about a rash on his chest the he reports has been spreading.     EMS  126/78 BP 98 HR 95% SPO2 on room air 124 CBG

## 2023-10-21 NOTE — ED Provider Notes (Signed)
 [place] EMERGENCY DEPARTMENT AT [facility] Provider Note   CSN: [id number] Arrival date & time: 10/21/23  8146     Patient presents with: Drug Overdose   HOUSTON ZAPIEN is a 43 y.o. male.   HPI   43 year old male presents emergency department from jail for concern of overdose.  Patient was reportedly somnolent and difficult to arouse so he was given Narcan .  He reportedly woke up after this.  Patient himself denies any drug use.  He states that he has been exhausted, not at sleep for over 72 hours and finally fell asleep.  He endorses a lot of external stressors.  He is tearful on exam, admits to worsening depression.  States at 1 point he was diagnosed bipolar with manic depression but has never had counseling or treatment.  He denies any specific SI at this time but states that his depression is worsening since his stepfather recently died.  He is not able to make the funeral because he is incarcerated.  He is also concern for the wellbeing of his mom that he cannot get a hold of.  Denies any other acute medical problems.  Prior to Admission medications   Medication Sig Start Date End Date Taking? Authorizing Provider  clotrimazole (LOTRIMIN) 1 % cream Apply to affected area 2 times daily 10/21/23  Yes Caleen Taaffe M, DO    Allergies: Patient has no known allergies.    Review of Systems  Constitutional:  Negative for fever.  Respiratory:  Negative for shortness of breath.   Cardiovascular:  Negative for chest pain.  Gastrointestinal:  Negative for abdominal pain, diarrhea and vomiting.  Skin:  Negative for rash.  Neurological:  Negative for headaches.  Psychiatric/Behavioral:  Positive for agitation and sleep disturbance. The patient is nervous/anxious.     Updated Vital Signs BP 120/81   Pulse 81   Temp 99.2 F (37.3 C) (Oral)   Resp 16   SpO2 90%   Physical Exam Vitals and nursing note reviewed.  Constitutional:      Appearance: Normal appearance.   HENT:     Head: Normocephalic.     Mouth/Throat:     Mouth: Mucous membranes are moist.  Eyes:     Pupils: Pupils are equal, round, and reactive to light.  Cardiovascular:     Rate and Rhythm: Normal rate.  Pulmonary:     Effort: Pulmonary effort is normal. No respiratory distress.  Abdominal:     Palpations: Abdomen is soft.     Tenderness: There is no abdominal tenderness.  Skin:    General: Skin is warm.     Comments: Tinea like rash on his mid chest  Neurological:     Mental Status: He is alert and oriented to person, place, and time. Mental status is at baseline.  Psychiatric:        Mood and Affect: Mood normal.     Comments: Tearful at times, cooperative and polite     (all labs ordered are listed, but only abnormal results are displayed) Labs Reviewed  CBC WITH DIFFERENTIAL/PLATELET  BASIC METABOLIC PANEL WITH GFR    EKG: None  Radiology: No results found.   Procedures   Medications Ordered in the ED  clotrimazole (LOTRIMIN) 1 % cream (has no administration in time range)  Medical Decision Making Amount and/or Complexity of Data Reviewed Labs: ordered.   43 year old male presents emergency department from jail after concern of possible overdose with response to Narcan .  Patient denies any drug use, states that he has had difficulty sleeping since his stepfather passed.  Endorses worsening depression.  History of bipolar and manic depression without appropriate treatment.  No SI/HI.  Tearful but cooperative and polite on exam.  Patient does have a tinea like rash on his anterior chest that will be treated with topical cream.  Will plan for basic blood work and refer to TTS for evaluation.  Patient is voluntary, asking to eat and drink, very thankful.     Final diagnoses:  None    ED Discharge Orders          Ordered    clotrimazole (LOTRIMIN) 1 % cream        10/21/23 2320               Cristina Ceniceros,  Roxie HERO, DO 10/21/23 2323

## 2023-10-22 ENCOUNTER — Encounter (HOSPITAL_COMMUNITY): Payer: Self-pay | Admitting: Psychiatry

## 2023-10-22 DIAGNOSIS — T50901A Poisoning by unspecified drugs, medicaments and biological substances, accidental (unintentional), initial encounter: Secondary | ICD-10-CM

## 2023-10-22 DIAGNOSIS — Z8659 Personal history of other mental and behavioral disorders: Secondary | ICD-10-CM | POA: Diagnosis not present

## 2023-10-22 DIAGNOSIS — F4325 Adjustment disorder with mixed disturbance of emotions and conduct: Secondary | ICD-10-CM

## 2023-10-22 DIAGNOSIS — F1191 Opioid use, unspecified, in remission: Secondary | ICD-10-CM

## 2023-10-22 NOTE — ED Provider Notes (Signed)
 Emergency Medicine Observation Re-evaluation Note  Billy Miles is a 43 y.o. male, seen on rounds today.  Pt initially presented to the ED for complaints of Drug Overdose Currently, the patient is resting.  Physical Exam  BP 120/81   Pulse 81   Temp 99.2 F (37.3 C) (Oral)   Resp 16   SpO2 90%  Physical Exam General: NAD Cardiac: Well perfused Lungs: Even and unlabored Psych: No agitation  ED Course / MDM  EKG:   I have reviewed the labs performed to date as well as medications administered while in observation.  Recent changes in the last 24 hours include seen by psychiatry, recommendations had included DC to jail with follow-up with [facility] team.  Plan  Current plan is for DC.    Jerrol Agent, MD 10/22/23 [phone]

## 2023-10-22 NOTE — Consult Note (Signed)
 [facility] Consult Note  Patient Name: Billy Miles MRN: [id number] DOB: 1980/03/02 DATE OF Consult: 10/22/2023  PRIMARY PSYCHIATRIC DIAGNOSES  1.  Adjustment disorder with mixed emotion/conduct 2.  Hx opioid use disorder 3.  Suspected accidental overdose  RECOMMENDATIONS  Inpt psych admission recommended:    [] YES       [x]  NO   Pt may return to jail and follow up with [facility] team, accessed by sick call request; he reports will request in morning   Medication recommendations:  deferred to [facility] Provider due to unaware of jail pharmacy formulary  Non-Medication recommendations:  recommend grief counseling   Communication: Treatment team members (and family members if applicable) who were involved in treatment/care discussions and planning, and with whom we spoke or engaged with via secure text/chat, include the following: epic chat [facility] Nurse Luke    I have discussed my assessment and treatment recommendations with the patient. Possible medication side effects/risks/benefits of current regimen.   Importance of medication adherence for medication to be beneficial.   Follow-Up Telepsychiatry C/L services:            []  We will continue to follow this patient with you.             [x]  Will sign off for now. Please re-consult our service as necessary.  Thank you for involving us  in the care of this patient. If you have any additional questions or concerns, please call [phone] and ask for me or the provider on-call.  TELEPSYCHIATRY ATTESTATION & CONSENT  As the provider for this telehealth consult, I attest that I verified the patient's identity using two separate identifiers, introduced myself to the patient, provided my credentials, disclosed my location, and performed this encounter via a HIPAA-compliant, real-time, face-to-face, two-way, interactive audio and video platform and with the full consent and agreement of the patient (or  guardian as applicable.)  Patient physical location: [facility] ED. Telehealth provider physical location: home office in state of [place]  Video start time: 02:03 am (Central Time) Video end time: 02:16 am (Central Time)  IDENTIFYING DATA  Billy Miles is a 43 y.o. year-old male for whom a psychiatric consultation has been ordered by the primary provider. The patient was identified using two separate identifiers.  CHIEF COMPLAINT/REASON FOR CONSULT  I had a panic attack, black out, not sleeping  HISTORY OF PRESENT ILLNESS (HPI)  Prefers the name [name]  The patient presents to ED from jail with probable accidental overdose, he reported was incoherent, nodding off, unresponsive until administered narcan  which awakened him, was agitated.  He adamantly denied taking any substance stating that he was exhausted that he had not slept in 3-4 days due to worry, anxiety, and depression.    Reports increased stress with stepfather recently died, worried about his mom, he is incarcerated and unable to reach her, worried she is not being taken care of, stated sister has nothing to do with her and his kids have their own lives so do not help  Hx of treatment for substance use disorder approx 8 yrs ago, denied formal mental health tx in past, he is not currently prescribed psychotropic medications  Today, client reports symptoms of depression with anergia, anhedonia, amotivation,  anxiety, frequent worry,  no reported panic symptoms, no reported obsessive/compulsive behaviors. Client denies active SI/HI ideations, plans or intent. There is no evidence of psychosis or delusional thinking.  Client denied past episodes of hypomania, hyperactivity, erratic/excessive  spending, involvement in dangerous activities, self-inflated ego, grandiosity, or promiscuity.    appetite starving, can't get enough to eat  concentration decreased    Reviewed active medication list/reviewed labs. Obtained Collateral  information from medical record.  PAST PSYCHIATRIC HISTORY    Previous Psychiatric Hospitalizations: denied Previous Detox/Residential treatments: methadone/suboxone clinic at [facility] over 8 yrs ago  Outpt treatment:  none  Previous psychotropic medication trials: denied Previous mental health diagnosis per client/MEDICAL RECORD NUMBERAdjustment disorder with mixed disturbance of emotions and conduct Substance induced mood disorder Opiate abuse, episodic Cocaine abuse   Suicide attempts/self-injurious behaviors:  [place] and also that he asked the police to shoot him was intoxicated and later denied this happened per record review   History of trauma/abuse/neglect/exploitation:  unknown at this time  PAST MEDICAL HISTORY  Past Medical History:  Diagnosis Date   Back pain    Knee pain       HOME MEDICATIONS  Facility Ordered Medications  Medication   clotrimazole (LOTRIMIN) 1 % cream      ALLERGIES  No Known Allergies  SOCIAL & SUBSTANCE USE HISTORY   Reports has a sister she doesn't have anything to do with my mom Living Situation:current incarcerated until June 2026 separated from wife  3 children adult       unemployed  Education: 8th grade wants to complete GED while in jail  has current legal issues  incarcerated for past 4 months; violation of probation; DWI, looks to be released next June      Have you used/abused any of the following (include frequency/amt/last use):  denied  UDS not available during this encounter       FAMILY HISTORY  History reviewed. No pertinent family history. Family Psychiatric History (if known):  unknown at this time  MENTAL STATUS EXAM (MSE)  Mental Status Exam: General Appearance: Disheveled  Orientation:  Full (Time, Place, and Person)  Memory:  Immediate;   Good Recent;   Good Remote;   Good  Concentration:  Concentration: Good  Recall:  Good  Attention  Good  Eye Contact:  Good  Speech:  Clear and  Coherent  Language:  Good  Volume:  Decreased  Mood: depressed, anxious  Affect:  Appropriate  Thought Process:  Goal Directed  Thought Content:  Logical  Suicidal Thoughts:  No  Homicidal Thoughts:  No  Judgement:  Fair  Insight:  Fair  Psychomotor Activity:  Normal  Akathisia:  Negative  Fund of Knowledge:  Good    Assets:  Communication Skills Desire for Improvement  Cognition:  WNL  ADL's:  Intact  AIMS (if indicated):       VITALS  Blood pressure 120/81, pulse 81, temperature 99.2 F (37.3 C), temperature source Oral, resp. rate 16, SpO2 90%.  LABS  Admission on 10/21/2023  Component Date Value Ref Range Status   WBC 10/21/2023 8.5  4.0 - 10.5 K/uL Final   RBC 10/21/2023 4.47  4.22 - 5.81 MIL/uL Final   Hemoglobin 10/21/2023 13.4  13.0 - 17.0 g/dL Final   HCT 90/69/7974 41.0  39.0 - 52.0 % Final   MCV 10/21/2023 91.7  80.0 - 100.0 fL Final   MCH 10/21/2023 30.0  26.0 - 34.0 pg Final   MCHC 10/21/2023 32.7  30.0 - 36.0 g/dL Final   RDW 90/69/7974 15.0  11.5 - 15.5 % Final   Platelets 10/21/2023 171  150 - 400 K/uL Final   nRBC 10/21/2023 0.0  0.0 - 0.2 %  Final   Neutrophils Relative % 10/21/2023 67  % Final   Neutro Abs 10/21/2023 5.8  1.7 - 7.7 K/uL Final   Lymphocytes Relative 10/21/2023 22  % Final   Lymphs Abs 10/21/2023 1.9  0.7 - 4.0 K/uL Final   Monocytes Relative 10/21/2023 8  % Final   Monocytes Absolute 10/21/2023 0.6  0.1 - 1.0 K/uL Final   Eosinophils Relative 10/21/2023 1  % Final   Eosinophils Absolute 10/21/2023 0.1  0.0 - 0.5 K/uL Final   Basophils Relative 10/21/2023 1  % Final   Basophils Absolute 10/21/2023 0.0  0.0 - 0.1 K/uL Final   Immature Granulocytes 10/21/2023 1  % Final   Abs Immature Granulocytes 10/21/2023 0.05  0.00 - 0.07 K/uL Final   Performed at [facility], [address], [place], [place] [address]   Sodium 10/21/2023 139  135 - 145 mmol/L Final   Potassium 10/21/2023 3.7  3.5 - 5.1 mmol/L Final   Chloride  10/21/2023 101  98 - 111 mmol/L Final   CO2 10/21/2023 25  22 - 32 mmol/L Final   Glucose, Bld 10/21/2023 124 (H)  70 - 99 mg/dL Final   Glucose reference range applies only to samples taken after fasting for at least 8 hours.   BUN 10/21/2023 15  6 - 20 mg/dL Final   Creatinine, Ser 10/21/2023 1.03  0.61 - 1.24 mg/dL Final   Calcium 90/69/7974 9.5  8.9 - 10.3 mg/dL Final   GFR, Estimated 10/21/2023 >60  >60 mL/min Final   Comment: (NOTE) Calculated using the CKD-EPI Creatinine Equation (2021)    Anion gap 10/21/2023 13  5 - 15 Final   Performed at [facility], [address] [place], [place] [address]    PSYCHIATRIC REVIEW OF SYSTEMS (ROS)  Depression:      []  Denies all symptoms of depression [x] Depressed mood       [x] Insomnia/hypersomnia              [x] Fatigue        [] Change in appetite     [x] Anhedonia                                [x] Difficulty concentrating      [] Hopelessness             [x] Worthlessness [x] Guilt/shame                [] Psychomotor agitation/retardation   Mania:     [x] Denies all symptoms of mania [] Elevated mood           [] Irritability         [] Pressured speech         []  Grandiosity         []  Decreased need for sleep                                                 [] Increased energy          []  Increase in goal directed activity                                       [] Flight of ideas    []  Excessive involvement  in high-risk behaviors                   []  Distractibility     Psychosis:     [x] Denies all symptoms of psychosis [] Paranoia         []  Auditory Hallucinations          [] Visual hallucinations         [] ELOC        [] IOR                [] Delusions   Suicide:    [x]  Denies SI/plan/intent []  Passive SI         []   Active SI         [] Plan           [] Intent   Homicide:  [x]   Denies HI/plan/intent []  Passive HI         []  Active HI         [] Plan            [] Intent           [] Identified Target    Additional findings:       Musculoskeletal: No abnormal movements observed      Gait & Station: Laying/Sitting      Pain Screening: Present - mild to moderate      Nutrition & Dental Concerns: none reported  RISK FORMULATION/ASSESSMENT  [place]-Suicide Severity Rating Scale (C-SSRS)  1) Have you wished you were dead or wished you could go to sleep and not wake up? no 2) Have you actually had any thoughts about killing yourself? no     Is the patient experiencing any suicidal or homicidal ideations:       [x] NO        Protective factors considered for safety management:   Absence of psychosis Access to adequate health care Advice& help seeking Resourcefulness/Survival skills Children Future oriented Suicide Inquiry:  Denies suicidal ideations, intentions, or plans.  Denies recent self-harm behavior. Talks futuristically.  Risk factors/concerns considered for safety management:  [] Prior attempt                                      [] Hopelessness  [] Family history of suicide                    [x] Impulsivity [x] Depression                                         [] Aggression [x] Substance abuse/dependence          [] Isolation [] Physical illness/chronic pain              [] Barriers to accessing treatment [x] Recent loss                                        [] Unwillingness to seek help [x] Access to lethal means                      [x] Male gender [] Age over 87                                        []   Unmarried   Is there a Astronomer plan with the patient and treatment team to minimize risk factors and promote protective factors:     [x] YES          []  NO            Explain: safety obs in ED; return to jail custody    Is crisis care placement or psychiatric hospitalization recommended:  [] YES    [x] NO  Based on my current evaluation and risk assessment, patient is determined at this time to be at:Low risk  Global Suicide Risk Assessment: The Patient is found to be at Low risk of suicide or  violence; however, risk lethality increased under context of drugs/alcohol. Encouraged to abstain  *RISK ASSESSMENT Risk assessment is a dynamic process; it is possible that this patient's condition, and risk level, may change. This should be re-evaluated and managed over time as appropriate. Please re-consult psychiatric consult services if additional assistance is needed in terms of risk assessment and management. If your team decides to discharge this patient, please advise the patient how to best access emergency psychiatric services, or to call 911, if their condition worsens or they feel unsafe in any way.    Total time spent in this encounter was 60 minutes with greater than 50% of time spent in counseling and coordination of care.     Dr. Edda JUDITHANN Ada, PhD, MSN, APRN, PMHNP-BC, MCJ Dewayne Jurek  KANDICE Ada, NP Telepsychiatry Consult Services

## 2023-10-22 NOTE — BH Assessment (Addendum)
 TTS Consult will be completed by [facility]. [facility] provider will notify of assessment time and provider name. Thanks

## 2023-10-22 NOTE — Progress Notes (Signed)
 ICM consulted for SNF placement. Per chart review, pt was brought in from jail. On site sheriff should be notified that patient is medically and psychiatrically cleared for discharge. No ICM needs identified. ICM signing off.
